# Patient Record
Sex: Male | Born: 1960 | Race: White | Hispanic: No | Marital: Married | Smoking: Former smoker
Health system: Southern US, Community
[De-identification: ages and names within clinical notes are randomized; demographics above are authoritative.]

## PROBLEM LIST (undated history)

## (undated) DIAGNOSIS — E785 Hyperlipidemia, unspecified: Secondary | ICD-10-CM

## (undated) DIAGNOSIS — M1990 Unspecified osteoarthritis, unspecified site: Secondary | ICD-10-CM

## (undated) DIAGNOSIS — R0602 Shortness of breath: Secondary | ICD-10-CM

## (undated) DIAGNOSIS — K219 Gastro-esophageal reflux disease without esophagitis: Secondary | ICD-10-CM

## (undated) DIAGNOSIS — R51 Headache: Secondary | ICD-10-CM

## (undated) DIAGNOSIS — E039 Hypothyroidism, unspecified: Secondary | ICD-10-CM

## (undated) DIAGNOSIS — M5416 Radiculopathy, lumbar region: Secondary | ICD-10-CM

## (undated) DIAGNOSIS — G8929 Other chronic pain: Secondary | ICD-10-CM

## (undated) DIAGNOSIS — M51369 Other intervertebral disc degeneration, lumbar region without mention of lumbar back pain or lower extremity pain: Secondary | ICD-10-CM

## (undated) DIAGNOSIS — N183 Chronic kidney disease, stage 3 unspecified: Secondary | ICD-10-CM

## (undated) DIAGNOSIS — M5136 Other intervertebral disc degeneration, lumbar region: Secondary | ICD-10-CM

## (undated) DIAGNOSIS — I2510 Atherosclerotic heart disease of native coronary artery without angina pectoris: Secondary | ICD-10-CM

## (undated) DIAGNOSIS — I10 Essential (primary) hypertension: Secondary | ICD-10-CM

## (undated) DIAGNOSIS — M5126 Other intervertebral disc displacement, lumbar region: Secondary | ICD-10-CM

## (undated) HISTORY — DX: Essential (primary) hypertension: I10

## (undated) HISTORY — PX: REATTACHMENT HAND: SHX5175

## (undated) HISTORY — PX: RENAL BIOPSY, PERCUTANEOUS: SUR144

---

## 1988-09-17 HISTORY — PX: ORIF TIBIA & FIBULA FRACTURES: SHX2131

## 2010-01-17 HISTORY — PX: KIDNEY TRANSPLANT: SHX239

## 2012-12-20 ENCOUNTER — Ambulatory Visit: Payer: Self-pay | Admitting: Cardiology

## 2012-12-21 ENCOUNTER — Encounter (HOSPITAL_COMMUNITY): Payer: Self-pay | Admitting: Pharmacist

## 2012-12-21 ENCOUNTER — Encounter (INDEPENDENT_AMBULATORY_CARE_PROVIDER_SITE_OTHER): Payer: Self-pay

## 2012-12-21 ENCOUNTER — Encounter: Payer: Self-pay | Admitting: *Deleted

## 2012-12-21 ENCOUNTER — Ambulatory Visit: Payer: Self-pay | Admitting: Internal Medicine

## 2012-12-21 ENCOUNTER — Encounter: Payer: Self-pay | Admitting: Internal Medicine

## 2012-12-21 ENCOUNTER — Ambulatory Visit (INDEPENDENT_AMBULATORY_CARE_PROVIDER_SITE_OTHER): Payer: Medicare Other | Admitting: Internal Medicine

## 2012-12-21 VITALS — BP 136/90 | HR 92 | Ht 70.0 in | Wt 212.0 lb

## 2012-12-21 DIAGNOSIS — R079 Chest pain, unspecified: Secondary | ICD-10-CM

## 2012-12-21 LAB — BASIC METABOLIC PANEL
CO2: 29 mEq/L (ref 19–32)
Calcium: 9.2 mg/dL (ref 8.4–10.5)
Chloride: 99 mEq/L (ref 96–112)
Creatinine, Ser: 1.4 mg/dL (ref 0.4–1.5)
GFR: 56.62 mL/min — ABNORMAL LOW (ref >60.00)
Sodium: 136 mEq/L (ref 135–145)

## 2012-12-21 LAB — CBC
HCT: 47.2 % (ref 39.0–52.0)
MCHC: 33.6 g/dL (ref 30.0–36.0)
MCV: 86.1 fl (ref 78.0–100.0)
Platelets: 246 10*3/uL (ref 150.0–400.0)
RBC: 5.48 Mil/uL (ref 4.22–5.81)

## 2012-12-21 LAB — PROTIME-INR: INR: 1.1 ratio — ABNORMAL HIGH (ref 0.8–1.0)

## 2012-12-21 NOTE — Progress Notes (Signed)
HPI About 2 wks ago  Pressure on chest  Hurts bad   Patient has horses  Walks to feed them  Chest starts hurting  Hard to breathe  Goes away after 20 min   Has been slowing down  Less energy No Chest pain at rest Kidney transplant at [facility] 2012.   Chronic LE edema.    No Known Allergies  Current Outpatient Prescriptions  Medication Sig Dispense Refill  . AMLODIPINE BESYLATE PO Take 5 mg by mouth.      . aspirin 81 MG tablet Take 81 mg by mouth daily.      . clonazePAM (KLONOPIN) 1 MG tablet Take 1 mg by mouth 2 (two) times daily.      . cyclobenzaprine (FLEXERIL) 5 MG tablet Take 5 mg by mouth as needed.      . levothyroxine (SYNTHROID, LEVOTHROID) 50 MCG tablet Take 50 mcg by mouth daily before breakfast.      . MAGNESIUM OXIDE PO Take 600 mg by mouth 2 (two) times daily.      . mycophenolate (MYFORTIC) 180 MG EC tablet Take by mouth 4 (four) times daily.      . nitroGLYCERIN (NITROSTAT) 0.4 MG SL tablet Place 0.4 mg under the tongue every 5 (five) minutes as needed for chest pain.      . Omega-3 Fatty Acids (FISH OIL) 1000 MG CPDR Take by mouth daily.      . omeprazole (PRILOSEC) 20 MG capsule Take 20 mg by mouth daily.      . Potassium & Sodium Phosphates (NEUTRA-PHOS PO) Take by mouth daily.      . simvastatin (ZOCOR) 20 MG tablet Take 20 mg by mouth daily.      . tacrolimus (PROGRAF) 1 MG capsule Take 1 mg by mouth 4 (four) times daily.      . Vitamin D, Ergocalciferol, (DRISDOL) 50000 UNITS CAPS capsule Take 50,000 Units by mouth daily.       No current facility-administered medications for this visit.    No past medical history on file.  No past surgical history on file.  Family History  Problem Relation Age of Onset  . Diabetes Father   . Heart disease Father   . Hyperlipidemia Father     History   Social History  . Marital Status: Married    Spouse Name: N/A    Number of Children: N/A  . Years of Education: N/A   Occupational History  . Not on file.    Social History Main Topics  . Smoking status: Former Smoker    Types: Cigarettes    Quit date: 12/21/1977  . Smokeless tobacco: Not on file  . Alcohol Use: Not on file  . Drug Use: Not on file  . Sexual Activity: Not on file   Other Topics Concern  . Not on file   Social History Narrative  . No narrative on file    Review of Systems:  All systems reviewed.  They are negative to the above problem except as previously stated.  Vital Signs: BP 136/90  Pulse 92  Ht 5' 10" (1.778 m)  Wt 212 lb (96.163 kg)  BMI 30.42 kg/m2  Physical Exam Patient is in NAD HEENT:  Normocephalic, atraumatic. EOMI, PERRLA.  Neck: JVP is normal.  No bruits.  Lungs: clear to auscultation. No rales no wheezes.  Heart: Regular rate and rhythm. Normal S1, S2. No S3.   No significant murmurs. PMI not displaced.  Abdomen:  Supple, nontender. Normal   bowel sounds. No masses. No hepatomegaly.  Extremities:   Good distal pulses throughout. No lower extremity edema.  Musculoskeletal :moving all extremities.  Neuro:   alert and oriented x3.  CN II-XII grossly intact.  EKG  SB 52  T wave inversion III, AVF. Assessment and Plan:  1.  CP  Patient's history is very concerning for progressive angina.  He has had no rest sympotms.  I have discussed with him and with M Mattingly  I would recomm L heart cath to define anatomy. Patient agrees to proceed CHeck labs today.   Patient will come early on day of procedure for prehydration.  2.  HTN  FOllow  3.  Renal  Check labs  Minimize contrast  May need to stage  4.  HL  Will need to check lipids.    

## 2012-12-21 NOTE — Patient Instructions (Signed)
Your physician has requested that you have a cardiac catheterization. Cardiac catheterization is used to diagnose and/or treat various heart conditions. Doctors may recommend this procedure for a number of different reasons. The most common reason is to evaluate chest pain. Chest pain can be a symptom of coronary artery disease (CAD), and cardiac catheterization can show whether plaque is narrowing or blocking your heart's arteries. This procedure is also used to evaluate the valves, as well as measure the blood flow and oxygen levels in different parts of your heart. For further information please visit [email or web address] Please follow instruction sheet, as given.  Your physician recommends that you have lab work today

## 2012-12-22 ENCOUNTER — Encounter: Payer: Self-pay | Admitting: Internal Medicine

## 2012-12-26 ENCOUNTER — Encounter (HOSPITAL_COMMUNITY): Payer: Self-pay | Admitting: General Practice

## 2012-12-26 ENCOUNTER — Ambulatory Visit (HOSPITAL_COMMUNITY)
Admission: RE | Admit: 2012-12-26 | Discharge: 2012-12-27 | Disposition: A | Discharge status: Home / Self Care | Payer: Medicare Other | Source: Ambulatory Visit | Attending: Cardiology | Admitting: Cardiology

## 2012-12-26 ENCOUNTER — Encounter (HOSPITAL_COMMUNITY)
Admission: RE | Disposition: A | Discharge status: Home / Self Care | Payer: Medicare Other | Source: Ambulatory Visit | Attending: Cardiology

## 2012-12-26 DIAGNOSIS — I10 Essential (primary) hypertension: Secondary | ICD-10-CM

## 2012-12-26 DIAGNOSIS — I129 Hypertensive chronic kidney disease with stage 1 through stage 4 chronic kidney disease, or unspecified chronic kidney disease: Secondary | ICD-10-CM | POA: Insufficient documentation

## 2012-12-26 DIAGNOSIS — K219 Gastro-esophageal reflux disease without esophagitis: Secondary | ICD-10-CM | POA: Diagnosis present

## 2012-12-26 DIAGNOSIS — E876 Hypokalemia: Secondary | ICD-10-CM | POA: Insufficient documentation

## 2012-12-26 DIAGNOSIS — N183 Chronic kidney disease, stage 3 unspecified: Secondary | ICD-10-CM | POA: Insufficient documentation

## 2012-12-26 DIAGNOSIS — Z7982 Long term (current) use of aspirin: Secondary | ICD-10-CM | POA: Insufficient documentation

## 2012-12-26 DIAGNOSIS — Z955 Presence of coronary angioplasty implant and graft: Secondary | ICD-10-CM

## 2012-12-26 DIAGNOSIS — I2510 Atherosclerotic heart disease of native coronary artery without angina pectoris: Secondary | ICD-10-CM

## 2012-12-26 DIAGNOSIS — N2889 Other specified disorders of kidney and ureter: Secondary | ICD-10-CM

## 2012-12-26 DIAGNOSIS — Z87891 Personal history of nicotine dependence: Secondary | ICD-10-CM | POA: Insufficient documentation

## 2012-12-26 DIAGNOSIS — E785 Hyperlipidemia, unspecified: Secondary | ICD-10-CM

## 2012-12-26 DIAGNOSIS — I200 Unstable angina: Secondary | ICD-10-CM

## 2012-12-26 DIAGNOSIS — E039 Hypothyroidism, unspecified: Secondary | ICD-10-CM | POA: Diagnosis present

## 2012-12-26 DIAGNOSIS — Z940 Kidney transplant status: Secondary | ICD-10-CM | POA: Insufficient documentation

## 2012-12-26 HISTORY — DX: Chronic kidney disease, stage 3 unspecified: N18.30

## 2012-12-26 HISTORY — DX: Other intervertebral disc degeneration, lumbar region without mention of lumbar back pain or lower extremity pain: M51.369

## 2012-12-26 HISTORY — DX: Atherosclerotic heart disease of native coronary artery without angina pectoris: I25.10

## 2012-12-26 HISTORY — DX: Radiculopathy, lumbar region: M54.16

## 2012-12-26 HISTORY — DX: Unspecified osteoarthritis, unspecified site: M19.90

## 2012-12-26 HISTORY — DX: Other chronic pain: G89.29

## 2012-12-26 HISTORY — PX: LEFT HEART CATHETERIZATION WITH CORONARY ANGIOGRAM: SHX5451

## 2012-12-26 HISTORY — DX: Hypothyroidism, unspecified: E03.9

## 2012-12-26 HISTORY — DX: Hyperlipidemia, unspecified: E78.5

## 2012-12-26 HISTORY — PX: CORONARY ANGIOPLASTY WITH STENT PLACEMENT: SHX49

## 2012-12-26 HISTORY — DX: Gastro-esophageal reflux disease without esophagitis: K21.9

## 2012-12-26 HISTORY — DX: Shortness of breath: R06.02

## 2012-12-26 HISTORY — DX: Other intervertebral disc displacement, lumbar region: M51.26

## 2012-12-26 HISTORY — DX: Headache: R51

## 2012-12-26 HISTORY — DX: Chronic kidney disease, stage 3 (moderate): N18.3

## 2012-12-26 HISTORY — DX: Other intervertebral disc degeneration, lumbar region: M51.36

## 2012-12-26 LAB — POTASSIUM: Potassium: 3.3 mEq/L — ABNORMAL LOW (ref 3.5–5.1)

## 2012-12-26 SURGERY — LEFT HEART CATHETERIZATION WITH CORONARY ANGIOGRAM
Anesthesia: LOCAL

## 2012-12-26 MED ORDER — FENTANYL CITRATE 0.05 MG/ML IJ SOLN
INTRAMUSCULAR | Status: AC
  Filled 2012-12-26: qty 2

## 2012-12-26 MED ORDER — SODIUM CHLORIDE 0.9 % IV SOLN
0.2500 mg/kg/h | INTRAVENOUS | Status: DC
  Filled 2012-12-26: qty 250

## 2012-12-26 MED ORDER — VERAPAMIL HCL 2.5 MG/ML IV SOLN
INTRAVENOUS | Status: AC
  Filled 2012-12-26: qty 2

## 2012-12-26 MED ORDER — ONDANSETRON HCL 4 MG/2ML IJ SOLN: 4.0000 mg | Freq: Four times a day (QID) | INTRAMUSCULAR | Status: DC | PRN

## 2012-12-26 MED ORDER — ASPIRIN EC 81 MG PO TBEC
81.0000 mg | DELAYED_RELEASE_TABLET | Freq: Every day | ORAL | Status: DC
Start: 2012-12-27 — End: 2012-12-27
  Administered 2012-12-27: 09:00:00 81 mg via ORAL
  Filled 2012-12-26: qty 1

## 2012-12-26 MED ORDER — CLONAZEPAM 1 MG PO TABS
2.0000 mg | ORAL_TABLET | Freq: Every day | ORAL | Status: DC
  Administered 2012-12-26: 2 mg via ORAL
  Filled 2012-12-26: qty 2

## 2012-12-26 MED ORDER — TACROLIMUS 1 MG PO CAPS
4.0000 mg | ORAL_CAPSULE | Freq: Two times a day (BID) | ORAL | Status: DC
  Administered 2012-12-26 – 2012-12-27 (×2): 4 mg via ORAL
  Filled 2012-12-26 (×3): qty 4

## 2012-12-26 MED ORDER — VITAMIN D (ERGOCALCIFEROL) 1.25 MG (50000 UNIT) PO CAPS: 50000.0000 [IU] | ORAL_CAPSULE | ORAL | Status: DC

## 2012-12-26 MED ORDER — AMLODIPINE BESYLATE 10 MG PO TABS
10.0000 mg | ORAL_TABLET | Freq: Every day | ORAL | Status: DC
  Administered 2012-12-27: 10 mg via ORAL
  Filled 2012-12-26: qty 1

## 2012-12-26 MED ORDER — SODIUM CHLORIDE 0.9 % IJ SOLN: 3.0000 mL | Freq: Two times a day (BID) | INTRAMUSCULAR | Status: DC

## 2012-12-26 MED ORDER — TICAGRELOR 90 MG PO TABS
ORAL_TABLET | ORAL | Status: AC
  Administered 2012-12-26: 90 mg via ORAL
  Filled 2012-12-26: qty 1

## 2012-12-26 MED ORDER — LEVOTHYROXINE SODIUM 50 MCG PO TABS
50.0000 ug | ORAL_TABLET | Freq: Every day | ORAL | Status: DC
  Administered 2012-12-27: 07:00:00 50 ug via ORAL
  Filled 2012-12-26 (×2): qty 1

## 2012-12-26 MED ORDER — ASPIRIN 81 MG PO CHEW: 81.0000 mg | CHEWABLE_TABLET | ORAL | Status: DC

## 2012-12-26 MED ORDER — NITROGLYCERIN 0.4 MG SL SUBL
SUBLINGUAL_TABLET | SUBLINGUAL | Status: AC
  Filled 2012-12-26: qty 25

## 2012-12-26 MED ORDER — HYDROCODONE-ACETAMINOPHEN 5-325 MG PO TABS
1.0000 | ORAL_TABLET | Freq: Four times a day (QID) | ORAL | Status: DC | PRN
Start: 2012-12-26 — End: 2012-12-27
  Administered 2012-12-26: 1 via ORAL

## 2012-12-26 MED ORDER — LIDOCAINE HCL (PF) 1 % IJ SOLN
INTRAMUSCULAR | Status: AC
  Filled 2012-12-26: qty 30

## 2012-12-26 MED ORDER — HYDROCODONE-ACETAMINOPHEN 5-325 MG PO TABS
ORAL_TABLET | ORAL | Status: AC
  Filled 2012-12-26: qty 1

## 2012-12-26 MED ORDER — SODIUM CHLORIDE 0.9 % IV SOLN: 250.0000 mL | INTRAVENOUS | Status: DC | PRN

## 2012-12-26 MED ORDER — SODIUM CHLORIDE 0.9 % IV SOLN
INTRAVENOUS | Status: DC
  Administered 2012-12-26: 09:00:00 via INTRAVENOUS

## 2012-12-26 MED ORDER — BIVALIRUDIN 250 MG IV SOLR
INTRAVENOUS | Status: AC
  Filled 2012-12-26: qty 250

## 2012-12-26 MED ORDER — SIMVASTATIN 20 MG PO TABS
20.0000 mg | ORAL_TABLET | Freq: Every day | ORAL | Status: DC
  Filled 2012-12-26: qty 1

## 2012-12-26 MED ORDER — CYCLOBENZAPRINE HCL 10 MG PO TABS
10.0000 mg | ORAL_TABLET | Freq: Three times a day (TID) | ORAL | Status: DC | PRN
  Administered 2012-12-26: 10 mg via ORAL
  Filled 2012-12-26: qty 1

## 2012-12-26 MED ORDER — POTASSIUM CHLORIDE CRYS ER 20 MEQ PO TBCR
40.0000 meq | EXTENDED_RELEASE_TABLET | Freq: Once | ORAL | Status: AC
  Administered 2012-12-26: 40 meq via ORAL
  Filled 2012-12-26: qty 2

## 2012-12-26 MED ORDER — SODIUM CHLORIDE 0.9 % IV SOLN
1.0000 mL/kg/h | INTRAVENOUS | Status: AC
  Administered 2012-12-26: 1 mL/kg/h via INTRAVENOUS

## 2012-12-26 MED ORDER — HEPARIN (PORCINE) IN NACL 2-0.9 UNIT/ML-% IJ SOLN
INTRAMUSCULAR | Status: AC
  Filled 2012-12-26: qty 1000

## 2012-12-26 MED ORDER — SODIUM CHLORIDE 0.9 % IJ SOLN: 3.0000 mL | INTRAMUSCULAR | Status: DC | PRN

## 2012-12-26 MED ORDER — MYCOPHENOLATE SODIUM 180 MG PO TBEC
720.0000 mg | DELAYED_RELEASE_TABLET | Freq: Two times a day (BID) | ORAL | Status: DC
  Administered 2012-12-26 – 2012-12-27 (×2): 720 mg via ORAL
  Filled 2012-12-26 (×3): qty 4

## 2012-12-26 MED ORDER — PANTOPRAZOLE SODIUM 40 MG PO TBEC
40.0000 mg | DELAYED_RELEASE_TABLET | Freq: Every day | ORAL | Status: DC
  Administered 2012-12-26 – 2012-12-27 (×2): 40 mg via ORAL
  Filled 2012-12-26 (×2): qty 1

## 2012-12-26 MED ORDER — TIZANIDINE HCL 4 MG PO TABS
4.0000 mg | ORAL_TABLET | Freq: Every evening | ORAL | Status: DC | PRN
  Filled 2012-12-26: qty 1

## 2012-12-26 MED ORDER — MAGNESIUM OXIDE 400 (241.3 MG) MG PO TABS
1200.0000 mg | ORAL_TABLET | Freq: Two times a day (BID) | ORAL | Status: DC
  Administered 2012-12-26: 17:00:00 1200 mg via ORAL
  Filled 2012-12-26 (×4): qty 3

## 2012-12-26 MED ORDER — FUROSEMIDE 20 MG PO TABS
20.0000 mg | ORAL_TABLET | Freq: Two times a day (BID) | ORAL | Status: DC
  Administered 2012-12-26 – 2012-12-27 (×2): 20 mg via ORAL
  Filled 2012-12-26 (×4): qty 1

## 2012-12-26 MED ORDER — TICAGRELOR 90 MG PO TABS
ORAL_TABLET | ORAL | Status: AC
  Filled 2012-12-26: qty 1

## 2012-12-26 MED ORDER — ACETAMINOPHEN 325 MG PO TABS: 650.0000 mg | ORAL_TABLET | ORAL | Status: DC | PRN

## 2012-12-26 MED ORDER — METOPROLOL TARTRATE 12.5 MG HALF TABLET
12.5000 mg | ORAL_TABLET | Freq: Two times a day (BID) | ORAL | Status: DC
  Administered 2012-12-26: 12.5 mg via ORAL
  Filled 2012-12-26 (×3): qty 1

## 2012-12-26 MED ORDER — NITROGLYCERIN 0.4 MG SL SUBL: 0.4000 mg | SUBLINGUAL_TABLET | SUBLINGUAL | Status: DC | PRN

## 2012-12-26 MED ORDER — INFLUENZA VAC SPLIT QUAD 0.5 ML IM SUSP
0.5000 mL | INTRAMUSCULAR | Status: DC
  Filled 2012-12-26: qty 0.5

## 2012-12-26 MED ORDER — NITROGLYCERIN 0.4 MG SL SUBL
0.4000 mg | SUBLINGUAL_TABLET | SUBLINGUAL | Status: DC | PRN
  Administered 2012-12-26 (×2): 0.4 mg via SUBLINGUAL
  Filled 2012-12-26: qty 25

## 2012-12-26 MED ORDER — MIDAZOLAM HCL 2 MG/2ML IJ SOLN
INTRAMUSCULAR | Status: AC
  Filled 2012-12-26: qty 2

## 2012-12-26 MED ORDER — SODIUM CHLORIDE 0.9 % IV BOLUS (SEPSIS)
250.0000 mL | Freq: Once | INTRAVENOUS | Status: AC
  Administered 2012-12-26: 250 mL via INTRAVENOUS

## 2012-12-26 MED ORDER — TICAGRELOR 90 MG PO TABS
90.0000 mg | ORAL_TABLET | Freq: Two times a day (BID) | ORAL | Status: DC
  Administered 2012-12-26 – 2012-12-27 (×2): 90 mg via ORAL
  Filled 2012-12-26 (×3): qty 1

## 2012-12-26 NOTE — H&P (View-Only) (Signed)
HPI About 2 wks ago  Pressure on chest  Hurts bad   Patient has horses  Walks to feed them  Chest starts hurting  Hard to breathe  Goes away after 20 min   Has been slowing down  Less energy No Chest pain at rest Kidney transplant at [facility] 2012.   Chronic LE edema.    No Known Allergies  Current Outpatient Prescriptions  Medication Sig Dispense Refill  . AMLODIPINE BESYLATE PO Take 5 mg by mouth.      Marland Kitchen aspirin 81 MG tablet Take 81 mg by mouth daily.      . clonazePAM (KLONOPIN) 1 MG tablet Take 1 mg by mouth 2 (two) times daily.      . cyclobenzaprine (FLEXERIL) 5 MG tablet Take 5 mg by mouth as needed.      Marland Kitchen levothyroxine (SYNTHROID, LEVOTHROID) 50 MCG tablet Take 50 mcg by mouth daily before breakfast.      . MAGNESIUM OXIDE PO Take 600 mg by mouth 2 (two) times daily.      . mycophenolate (MYFORTIC) 180 MG EC tablet Take by mouth 4 (four) times daily.      . nitroGLYCERIN (NITROSTAT) 0.4 MG SL tablet Place 0.4 mg under the tongue every 5 (five) minutes as needed for chest pain.      . Omega-3 Fatty Acids (FISH OIL) 1000 MG CPDR Take by mouth daily.      Marland Kitchen omeprazole (PRILOSEC) 20 MG capsule Take 20 mg by mouth daily.      . Potassium & Sodium Phosphates (NEUTRA-PHOS PO) Take by mouth daily.      . simvastatin (ZOCOR) 20 MG tablet Take 20 mg by mouth daily.      . tacrolimus (PROGRAF) 1 MG capsule Take 1 mg by mouth 4 (four) times daily.      . Vitamin D, Ergocalciferol, (DRISDOL) 50000 UNITS CAPS capsule Take 50,000 Units by mouth daily.       No current facility-administered medications for this visit.    No past medical history on file.  No past surgical history on file.  Family History  Problem Relation Age of Onset  . Diabetes Father   . Heart disease Father   . Hyperlipidemia Father     History   Social History  . Marital Status: Married    Spouse Name: N/A    Number of Children: N/A  . Years of Education: N/A   Occupational History  . Not on file.    Social History Main Topics  . Smoking status: Former Smoker    Types: Cigarettes    Quit date: 12/21/1977  . Smokeless tobacco: Not on file  . Alcohol Use: Not on file  . Drug Use: Not on file  . Sexual Activity: Not on file   Other Topics Concern  . Not on file   Social History Narrative  . No narrative on file    Review of Systems:  All systems reviewed.  They are negative to the above problem except as previously stated.  Vital Signs: BP 136/90  Pulse 92  Ht 5\' 10"  (1.778 m)  Wt 212 lb (96.163 kg)  BMI 30.42 kg/m2  Physical Exam Patient is in NAD HEENT:  Normocephalic, atraumatic. EOMI, PERRLA.  Neck: JVP is normal.  No bruits.  Lungs: clear to auscultation. No rales no wheezes.  Heart: Regular rate and rhythm. Normal S1, S2. No S3.   No significant murmurs. PMI not displaced.  Abdomen:  Supple, nontender. Normal  bowel sounds. No masses. No hepatomegaly.  Extremities:   Good distal pulses throughout. No lower extremity edema.  Musculoskeletal :moving all extremities.  Neuro:   alert and oriented x3.  CN II-XII grossly intact.  EKG  SB 52  T wave inversion III, AVF. Assessment and Plan:  1.  CP  Patient's history is very concerning for progressive angina.  He has had no rest sympotms.  I have discussed with him and with Renee Rival  I would recomm L heart cath to define anatomy. Patient agrees to proceed CHeck labs today.   Patient will come early on day of procedure for prehydration.  2.  HTN  FOllow  3.  Renal  Check labs  Minimize contrast  May need to stage  4.  HL  Will need to check lipids.

## 2012-12-26 NOTE — Interval H&P Note (Signed)
History and Physical Interval Note:  12/26/2012 10:26 AM  Robert Walton  has presented today for surgery, with the diagnosis of Chest pain  The various methods of treatment have been discussed with the patient and family. After consideration of risks, benefits and other options for treatment, the patient has consented to  Procedure(s): LEFT HEART CATHETERIZATION WITH CORONARY ANGIOGRAM (N/A) as a surgical intervention .  The patient's history has been reviewed, patient examined, no change in status, stable for surgery.  I have reviewed the patient's chart and labs.  Questions were answered to the patient's satisfaction.   Cath Lab Visit (complete for each Cath Lab visit)  Clinical Evaluation Leading to the Procedure:   ACS: yes  Non-ACS:    Anginal Classification: CCS III  Anti-ischemic medical therapy: Minimal Therapy (1 class of medications)  Non-Invasive Test Results: No non-invasive testing performed  Prior CABG: No previous CABG        Theron Arista [facility] 12/26/2012 10:26 AM

## 2012-12-26 NOTE — CV Procedure (Signed)
    Cardiac Catheterization Procedure Note  Name: Robert Walton MRN: [id number] DOB: 19-Jun-1960  Procedure: Left Heart Cath, Selective Coronary Angiography, LV angiography, PTCA and stenting of the first OM.  Indication: 52 yo WM with history of HTN and hyperlipidemia presents with unstable angina.  Procedural Details:  The right wrist was prepped, draped, and anesthetized with 1% lidocaine. Using the modified Seldinger technique, a 5 French sheath was introduced into the right radial artery. 3 mg of verapamil was administered through the sheath, weight-based unfractionated heparin was administered intravenously. Standard Judkins catheters were used for selective coronary angiography and left ventriculography. Catheter exchanges were performed over an exchange length guidewire.  PROCEDURAL FINDINGS Hemodynamics: AO 114/72 mean 91 mm Hg LV 112/7 mm Hg   Coronary angiography: Coronary dominance: left  Left mainstem: Short, normal.  Left anterior descending (LAD): Normal.  Left circumflex (LCx): large dominant vessel. The first OM has a 99% stenosis with TIMI 1 flow. It is a large bifurcation vessel. There is 20-30% disease in the PDA.  Right coronary artery (RCA): Small, normal.  Left ventriculography: Left ventricular systolic function is normal, LVEF is estimated at 55-65%, there is no significant mitral regurgitation   PCI Note:  Following the diagnostic procedure, the decision was made to proceed with PCI. Brilinta 180 mg was given orally.  Weight-based bivalirudin was given for anticoagulation. Once a therapeutic ACT was achieved, a 6 [place] FL3.5 guide catheter was inserted. Guide selection was difficult given the short left main.   A prowater coronary guidewire was used to access the left circumflex artery. A second prowater wire was used to cross the lesion.  The lesion was predilated with a 2.5 mm balloon.  The lesion was then stented with a 2.5 x 20 mm Promus stent.  The stent  was postdilated with a 2.75 mm noncompliant balloon.  Following PCI, there was 0% residual stenosis and TIMI-3 flow. Final angiography confirmed an excellent result. The patient tolerated the procedure well. There were no immediate procedural complications. A TR band was used for radial hemostasis. The patient was transferred to the post catheterization recovery area for further monitoring.  PCI Data: Vessel - OM1/Segment - proximal Percent Stenosis (pre)  99% TIMI-flow 1 Stent 2.5 x 20 mm Promus Percent Stenosis (post) 0% TIMI-flow (post) 3  Final Conclusions:   1. Single vessel obstructive CAD  2. Normal LV function.   Recommendations:  Continue dual antiplatelet therapy for one year.  Theron Arista [facility] 12/26/2012, 11:30 AM

## 2012-12-26 NOTE — Progress Notes (Signed)
O2 STARTED AT 2L/MIN

## 2012-12-26 NOTE — Progress Notes (Signed)
TR BAND REMOVAL  LOCATION:    right radial  DEFLATED PER PROTOCOL:    yes  TIME BAND OFF / DRESSING APPLIED:    1600   SITE UPON ARRIVAL:    Level 0  SITE AFTER BAND REMOVAL:    Level 0  REVERSE ALLEN'S TEST:     positive  CIRCULATION SENSATION AND MOVEMENT:    Within Normal Limits   yes  COMMENTS:   Gauze dressing applied at 1600. Dressing at 1630 dry and intact and CSMs wnls, radial and ulnar pulse present and palpable.

## 2012-12-27 ENCOUNTER — Encounter (HOSPITAL_COMMUNITY): Payer: Self-pay | Admitting: Nurse Practitioner

## 2012-12-27 ENCOUNTER — Other Ambulatory Visit: Payer: Self-pay | Admitting: Nurse Practitioner

## 2012-12-27 DIAGNOSIS — E039 Hypothyroidism, unspecified: Secondary | ICD-10-CM | POA: Diagnosis present

## 2012-12-27 DIAGNOSIS — K219 Gastro-esophageal reflux disease without esophagitis: Secondary | ICD-10-CM | POA: Diagnosis present

## 2012-12-27 DIAGNOSIS — E785 Hyperlipidemia, unspecified: Secondary | ICD-10-CM | POA: Diagnosis present

## 2012-12-27 DIAGNOSIS — R0602 Shortness of breath: Secondary | ICD-10-CM | POA: Insufficient documentation

## 2012-12-27 DIAGNOSIS — I2510 Atherosclerotic heart disease of native coronary artery without angina pectoris: Secondary | ICD-10-CM

## 2012-12-27 DIAGNOSIS — I10 Essential (primary) hypertension: Secondary | ICD-10-CM | POA: Diagnosis present

## 2012-12-27 DIAGNOSIS — I200 Unstable angina: Secondary | ICD-10-CM

## 2012-12-27 LAB — BASIC METABOLIC PANEL
Calcium: 8.9 mg/dL (ref 8.4–10.5)
Chloride: 102 mEq/L (ref 96–112)
Creatinine, Ser: 1.5 mg/dL — ABNORMAL HIGH (ref 0.50–1.35)
GFR calc non Af Amer: 51 mL/min — ABNORMAL LOW (ref >90)
Glucose, Bld: 114 mg/dL — ABNORMAL HIGH (ref 70–99)
Sodium: 138 mEq/L (ref 135–145)

## 2012-12-27 LAB — CBC
HCT: 43.2 % (ref 39.0–52.0)
MCH: 28.5 pg (ref 26.0–34.0)
MCHC: 34 g/dL (ref 30.0–36.0)
MCV: 83.9 fL (ref 78.0–100.0)
Platelets: 203 10*3/uL (ref 150–400)

## 2012-12-27 MED ORDER — POTASSIUM CHLORIDE CRYS ER 20 MEQ PO TBCR
40.0000 meq | EXTENDED_RELEASE_TABLET | Freq: Once | ORAL | Status: AC
  Administered 2012-12-27: 10:00:00 40 meq via ORAL
  Filled 2012-12-27: qty 2

## 2012-12-27 MED ORDER — TICAGRELOR 90 MG PO TABS: 90.0000 mg | ORAL_TABLET | Freq: Two times a day (BID) | ORAL | Status: DC

## 2012-12-27 MED ORDER — METOPROLOL TARTRATE 25 MG PO TABS: 25.0000 mg | ORAL_TABLET | Freq: Two times a day (BID) | ORAL | Status: DC

## 2012-12-27 MED ORDER — METOPROLOL TARTRATE 25 MG PO TABS
25.0000 mg | ORAL_TABLET | Freq: Two times a day (BID) | ORAL | Status: DC
  Administered 2012-12-27: 25 mg via ORAL

## 2012-12-27 MED FILL — Sodium Chloride IV Soln 0.9%: INTRAVENOUS | Qty: 50 | Status: AC

## 2012-12-27 NOTE — Discharge Summary (Signed)
See my full note. cdm 

## 2012-12-27 NOTE — Progress Notes (Signed)
CARDIAC REHAB PHASE I   PRE:  Rate/Rhythm: 83SR  BP:  Supine: 129/89  Sitting:   Standing:    SaO2:   MODE:  Ambulation: 800 ft   POST:  Rate/Rhythm: 91SR  BP:  Supine:   Sitting: 134/85  Standing:    SaO2:  4010-2725 Pt walked 800 ft on RA with steady gait. Tolerated well. No CP. Education completed with pt and wife. Understanding voiced. Concerned about whether they will be able to afford brilinta after free month. Wife to check with pharmacy to see what it will cost. Stressed importance of taking antiplatelet med and told them to discuss with cardiologist at visit if they cannot afford brilinta so that another med can be given. Discussed CRP 2 and pt gave permission to refer to [facility] Phase 2.   Luetta Nutting, RN BSN  12/27/2012 8:39 AM

## 2012-12-27 NOTE — Progress Notes (Signed)
     SUBJECTIVE: NO chest pain or SOB. NO events.   BP 123/82  Pulse 85  Temp(Src) 97.9 F (36.6 C) (Oral)  Resp 16  Ht 5\' 10"  (1.778 m)  Wt 213 lb 6.5 oz (96.8 kg)  BMI 30.62 kg/m2  SpO2 94%  Intake/Output Summary (Last 24 hours) at 12/27/12 9604 Last data filed at 12/26/12 2300  Gross per 24 hour  Intake   1135 ml  Output   1400 ml  Net   -265 ml    PHYSICAL EXAM General: Well developed, well nourished, in no acute distress. Alert and oriented x 3.  Psych:  Good affect, responds appropriately Neck: No JVD. No masses noted.  Lungs: Clear bilaterally with no wheezes or rhonci noted.  Heart: RRR with no murmurs noted. Abdomen: Bowel sounds are present. Soft, non-tender.  Extremities: No lower extremity edema.   LABS: Basic Metabolic Panel:  Recent Labs  54/09/81 0910 12/27/12 0520  NA  --  138  K 3.3* 3.6  CL  --  102  CO2  --  27  GLUCOSE  --  114*  BUN  --  12  CREATININE  --  1.50*  CALCIUM  --  8.9   CBC:  Recent Labs  12/27/12 0520  WBC 4.1  HGB 14.7  HCT 43.2  MCV 83.9  PLT 203   Current Meds: . amLODipine  10 mg Oral Daily  . aspirin EC  81 mg Oral Daily  . clonazePAM  2 mg Oral QHS  . furosemide  20 mg Oral BID  . influenza vac split quadrivalent PF  0.5 mL Intramuscular Tomorrow-1000  . levothyroxine  50 mcg Oral QAC breakfast  . magnesium oxide  1,200 mg Oral Q12H  . metoprolol tartrate  12.5 mg Oral BID  . mycophenolate  720 mg Oral BID  . pantoprazole  40 mg Oral Daily  . simvastatin  20 mg Oral q1800  . tacrolimus  4 mg Oral BID  . Ticagrelor  90 mg Oral BID  . [START ON 12/31/2012] Vitamin D (Ergocalciferol)  50,000 Units Oral Q7 days    ASSESSMENT AND PLAN:  1. CAD/Unstable angina: Pt admitted yesterday after outpatient cath per Dr. [place]. Found to have severe stenosis OM1. Now s/p DES x 1 OM1. He will need dual anti-platelet therapy with ASA and Brilinta for at least one year. He has been started on a beta blocker. Continue  statin.   2. HTN: BP controlled. No changes.   3. S/p kidney transplant: Renal function at baseline this am.   4. Hypokalemia: Replace potassium this am.   5. Dispo: D/C home today. Follow up with Dr. Tenny Craw in 2-3 weeks. He will need a Brilinta starter packet.   Robert Walton  12/11/20146:58 AM

## 2012-12-27 NOTE — Discharge Summary (Signed)
Discharge Summary   Patient ID: Robert Walton,  MRN: [id number], DOB/AGE: 1960/05/13 52 y.o.  Admit date: 12/26/2012 Discharge date: 12/27/2012  Primary Care Provider: Abelina Bachelor Primary Cardiologist: Lovina Reach, MD  Primary Nephrologist: Renee Rival, MD  Discharge Diagnoses Principal Problem:   Unstable angina  **s/p PCI and drug-eluting stent placement to the first obtuse marginal branch of the left circumflex this admission.  Active Problems:   Coronary atherosclerosis of native coronary artery   Chronic renal insufficiency, stage III (moderate)   HTN (hypertension)   Hyperlipidemia   Hypothyroidism   GERD (gastroesophageal reflux disease)  Allergies No Known Allergies  Procedures  Cardiac Catheterization and Percutaneous Coronary Intervention 12.10.2014  PROCEDURAL FINDINGS Hemodynamics: AO 114/72 mean 91 mm Hg LV 112/7 mm Hg              Coronary angiography: Coronary dominance: left  Left mainstem: Short, normal. Left anterior descending (LAD): Normal.  Left circumflex (LCx): large dominant vessel. The first OM has a 99% stenosis with TIMI 1 flow. It is a large bifurcation vessel. There is 20-30% disease in the PDA.   **The OM1 was successfully stented using a 2.5 x 20 mm Promus drug-eluting stent.**  Right coronary artery (RCA): Small, normal. Left ventriculography: Left ventricular systolic function is normal, LVEF is estimated at 55-65%, there is no significant mitral regurgitation  _____________   History of Present Illness  52 y/o male with a prior h/o HTN, hyperlipidemia, and hypothyroidism, without prior cardiac history, who was in his usual state of health until approximately 3 wks prior to admission, when he began to experience exertional chest discomfort and fatigue.  He was seen in cardiology clinic on 12/5, and decision was made to pursue diagnostic catheterization.   Hospital Course  Patient presented to [facility] on December 10, and  was hydrated pre-cath.  Diagnostic catheterization was then performed and revealed a severe stenosis within the first obtuse marginal branch of the left circumflex.  He otherwise had non-obstructive CAD and normal LV function.  The OM1 was then successfully stented using a 2.5 x 20 mm Promus drug-eluting stent.  Robert Walton tolerated the procedure well and post-procedure has had no recurrence of chest discomfort.  He has been ambulating without limitations and his creatinine remains stable this morning at 1.50.  He will be discharged home today in good condition.  We have arranged for f/u bmet next week and he will otherwise f/u in cardiology clinic on 12/22.  Discharge Vitals Blood pressure 123/82, pulse 85, temperature 97.9 F (36.6 C), temperature source Oral, resp. rate 16, height 5\' 10"  (1.778 m), weight 213 lb 6.5 oz (96.8 kg), SpO2 94.00%.  Filed Weights   12/26/12 0835 12/26/12 2347  Weight: 212 lb (96.163 kg) 213 lb 6.5 oz (96.8 kg)   Labs  CBC  Recent Labs  12/27/12 0520  WBC 4.1  HGB 14.7  HCT 43.2  MCV 83.9  PLT 203   Basic Metabolic Panel  Recent Labs  12/26/12 0910 12/27/12 0520  NA  --  138  K 3.3* 3.6  CL  --  102  CO2  --  27  GLUCOSE  --  114*  BUN  --  12  CREATININE  --  1.50*  CALCIUM  --  8.9   Disposition  Pt is being discharged home today in good condition.  Follow-up Plans & Appointments      Follow-up Information   Follow up with [facility] On 12/31/2012. (blood  chemistry - anytime between 9am and 3pm.)    Contact information:   [address] [place] [phone]      Follow up with Dyke Maes, MD. (as scheduled.)    Specialty:  Nephrology   Contact information:   [address] [place] [address] [phone]       Follow up with Abelina Bachelor, MD. (as scheduled)    Specialty:  Family Medicine   Contact information:   [address] [place] [address] [phone]       Follow  up with Dietrich Pates, MD On 01/07/2013. (10:45 AM)    Specialty:  Cardiology   Contact information:   [address] [place] [address] [phone]      Discharge Medications    Medication List         amLODipine 10 MG tablet  Commonly known as:  NORVASC  Take 10 mg by mouth daily.     aspirin EC 81 MG tablet  Take 81 mg by mouth daily.     clonazePAM 1 MG tablet  Commonly known as:  KLONOPIN  Take 2 mg by mouth at bedtime.     FLEXERIL 10 MG tablet  Generic drug:  cyclobenzaprine  Take 10 mg by mouth 3 (three) times daily as needed (for pain).     furosemide 20 MG tablet  Commonly known as:  LASIX  Take 20 mg by mouth 2 (two) times daily.     HYDROcodone-acetaminophen 5-325 MG per tablet  Commonly known as:  NORCO/VICODIN  Take 1 tablet by mouth every 6 (six) hours as needed (pain).     levothyroxine 50 MCG tablet  Commonly known as:  SYNTHROID, LEVOTHROID  Take 50 mcg by mouth daily before breakfast.     magnesium oxide 400 MG tablet  Commonly known as:  MAG-OX  Take 1,200 mg by mouth 2 (two) times daily. Take 2 hours before taking prograf and myfortic     metoprolol tartrate 25 MG tablet  Commonly known as:  LOPRESSOR  Take 1 tablet (25 mg total) by mouth 2 (two) times daily.     mycophenolate 180 MG EC tablet  Commonly known as:  MYFORTIC  Take 720 mg by mouth 2 (two) times daily.     nitroGLYCERIN 0.4 MG SL tablet  Commonly known as:  NITROSTAT  Place 0.4 mg under the tongue every 5 (five) minutes as needed for chest pain.     omeprazole 20 MG capsule  Commonly known as:  PRILOSEC  Take 20 mg by mouth daily.     simvastatin 20 MG tablet  Commonly known as:  ZOCOR  Take 20 mg by mouth daily.     tacrolimus 1 MG capsule  Commonly known as:  PROGRAF  Take 4 mg by mouth 2 (two) times daily.     Ticagrelor 90 MG Tabs tablet  Commonly known as:  BRILINTA  Take 1 tablet (90 mg total) by mouth 2 (two) times daily.     tiZANidine  4 MG tablet  Commonly known as:  ZANAFLEX  Take 4 mg by mouth at bedtime as needed for muscle spasms.     Vitamin D (Ergocalciferol) 50000 UNITS Caps capsule  Commonly known as:  DRISDOL  Take 50,000 Units by mouth every 7 (seven) days. Take on modays       Outstanding Labs/Studies  BMET next week.  Duration of Discharge Encounter   Greater than 30 minutes  including physician time.  Signed, Nicolasa Ducking NP 12/27/2012, 8:09 AM

## 2012-12-27 NOTE — Progress Notes (Signed)
Tele shows st 100's at rest and HR 120's with minimal activity of moving in bed or using urinal.  BP 133/94.  Dr Terressa Koyanagi informed, orders received, metoprolol given PO after medication discussed w/ pt.

## 2012-12-31 ENCOUNTER — Other Ambulatory Visit (INDEPENDENT_AMBULATORY_CARE_PROVIDER_SITE_OTHER): Payer: Medicare Other

## 2012-12-31 ENCOUNTER — Telehealth: Payer: Self-pay

## 2012-12-31 ENCOUNTER — Telehealth: Payer: Self-pay | Admitting: Cardiology

## 2012-12-31 LAB — BASIC METABOLIC PANEL
BUN: 20 mg/dL (ref 6–23)
Calcium: 9.3 mg/dL (ref 8.4–10.5)
GFR: 41.48 mL/min — ABNORMAL LOW (ref >60.00)
Potassium: 3.5 mEq/L (ref 3.5–5.1)
Sodium: 140 mEq/L (ref 135–145)

## 2012-12-31 NOTE — Telephone Encounter (Signed)
PATIENT WAS HERE FOR LAB AND NEEDS SAMPLE OF BRINITLA, GAVE HIM SAMPLES UP

## 2012-12-31 NOTE — Telephone Encounter (Signed)
Walk in pt Form " [place] & [place] Application"  Dropped Off gave to [facility]

## 2013-01-07 ENCOUNTER — Ambulatory Visit (INDEPENDENT_AMBULATORY_CARE_PROVIDER_SITE_OTHER): Payer: Medicare Other | Admitting: Internal Medicine

## 2013-01-07 ENCOUNTER — Encounter: Payer: Self-pay | Admitting: Internal Medicine

## 2013-01-07 VITALS — BP 123/82 | HR 75 | Ht 70.0 in | Wt 208.8 lb

## 2013-01-07 DIAGNOSIS — I2510 Atherosclerotic heart disease of native coronary artery without angina pectoris: Secondary | ICD-10-CM

## 2013-01-07 LAB — LIPID PANEL
Cholesterol: 166 mg/dL (ref 0–200)
HDL: 36.1 mg/dL — ABNORMAL LOW (ref >39.00)
Total CHOL/HDL Ratio: 5
Triglycerides: 172 mg/dL — ABNORMAL HIGH (ref 0.0–149.0)
VLDL: 34.4 mg/dL (ref 0.0–40.0)

## 2013-01-07 LAB — BASIC METABOLIC PANEL
CO2: 26 mEq/L (ref 19–32)
Calcium: 9.7 mg/dL (ref 8.4–10.5)
GFR: 48.47 mL/min — ABNORMAL LOW (ref >60.00)
Sodium: 137 mEq/L (ref 135–145)

## 2013-01-07 NOTE — Progress Notes (Signed)
HPI Patient is a 52 yo with a history of HTN, renal Tx. I saw him a few wks ago  Came in with symptoms worrisome for porgressive angina  I recomm LHC He had this done  It showed: 99% lesion in a large OM (L dominant system)  LAD and RCA normal Patient underwent PTCA/PROMUS stent placement  Since d/c he has felt mucht better  Slowly increasing his activity. Denies CP  Breathing is better.     No Known Allergies  Current Outpatient Prescriptions  Medication Sig Dispense Refill  . amLODipine (NORVASC) 10 MG tablet Take 10 mg by mouth daily.      Marland Kitchen aspirin EC 81 MG tablet Take 81 mg by mouth daily.      . clonazePAM (KLONOPIN) 1 MG tablet Take 2 mg by mouth at bedtime.       . cyclobenzaprine (FLEXERIL) 10 MG tablet Take 10 mg by mouth 3 (three) times daily as needed (for pain).      . furosemide (LASIX) 20 MG tablet Take 20 mg by mouth 2 (two) times daily.      Marland Kitchen HYDROcodone-acetaminophen (NORCO/VICODIN) 5-325 MG per tablet Take 1 tablet by mouth every 6 (six) hours as needed (pain).      Marland Kitchen levothyroxine (SYNTHROID, LEVOTHROID) 50 MCG tablet Take 50 mcg by mouth daily before breakfast.      . magnesium oxide (MAG-OX) 400 MG tablet Take 1,200 mg by mouth 2 (two) times daily. Take 2 hours before taking prograf and myfortic      . metoprolol tartrate (LOPRESSOR) 25 MG tablet Take 1 tablet (25 mg total) by mouth 2 (two) times daily.  60 tablet  6  . mycophenolate (MYFORTIC) 180 MG EC tablet Take 720 mg by mouth 2 (two) times daily.       . nitroGLYCERIN (NITROSTAT) 0.4 MG SL tablet Place 0.4 mg under the tongue every 5 (five) minutes as needed for chest pain.      Marland Kitchen omeprazole (PRILOSEC) 20 MG capsule Take 20 mg by mouth daily.      . simvastatin (ZOCOR) 40 MG tablet Take 40 mg by mouth daily.      . tacrolimus (PROGRAF) 1 MG capsule Take 4 mg by mouth 2 (two) times daily.       . Ticagrelor (BRILINTA) 90 MG TABS tablet Take 1 tablet (90 mg total) by mouth 2 (two) times daily.  60 tablet  6  .  tiZANidine (ZANAFLEX) 4 MG tablet Take 4 mg by mouth at bedtime as needed for muscle spasms.      . Vitamin D, Ergocalciferol, (DRISDOL) 50000 UNITS CAPS capsule Take 50,000 Units by mouth every 7 (seven) days. Take on modays       No current facility-administered medications for this visit.    Past Medical History  Diagnosis Date  . Hypertension   . CKD (chronic kidney disease), stage III     a. s/p renal transplant 2012.  . Bulging lumbar disc     "I've got 5; they've done 3 injections then cut nerves last time" (12/26/2008)  . Hyperlipidemia   . Exertional shortness of breath     "just for the last 3 wks" (12/26/2012)  . Hypothyroidism   . GERD (gastroesophageal reflux disease)   . ZOXWRUEA(540.9)     "probably monthly" (12/26/2012)  . Arthritis     "my back's eat up w/arthritis" (12/26/2012()  . Chronic radicular pain of lower back     "both legs" (12/26/2012)  .  CAD (coronary artery disease)     a. 12/2012 Cath/PCI: LM nl, LAD nl, LCX dom, nl, OM1 99 (2.5x20 Promus DES), LPDA 20-30, RCA sm, nl, EF 55-65%.    Past Surgical History  Procedure Laterality Date  . Coronary angioplasty with stent placement  12/26/2012    "1" (12/26/2012)  . Kidney transplant  2012  . Renal biopsy, percutaneous Right ~ 2006  . Reattachment hand Right 1979; 1986    "middle finger; thumb"  . Orif tibia & fibula fractures Right 1990's    "titanium rod and 4 screws"    Family History  Problem Relation Age of Onset  . Diabetes Father   . Heart disease Father   . Hyperlipidemia Father     History   Social History  . Marital Status: Married    Spouse Name: N/A    Number of Children: N/A  . Years of Education: N/A   Occupational History  . Not on file.   Social History Main Topics  . Smoking status: Former Smoker -- 1.50 packs/day for 2 years    Types: Cigarettes    Quit date: 12/21/1977  . Smokeless tobacco: Never Used  . Alcohol Use: No  . Drug Use: No  . Sexual Activity: Yes    Other Topics Concern  . Not on file   Social History Narrative  . No narrative on file    Review of Systems:  All systems reviewed.  They are negative to the above problem except as previously stated.  Vital Signs: BP 123/82  Pulse 75  Ht 5\' 10"  (1.778 m)  Wt 208 lb 12.8 oz (94.711 kg)  BMI 29.96 kg/m2  Physical Exam Patient is in NAD HEENT:  Normocephalic, atraumatic. EOMI, PERRLA.  Neck: JVP is normal.  No bruits.  Lungs: clear to auscultation. No rales no wheezes.  Heart: Regular rate and rhythm. Normal S1, S2. No S3.   No significant murmurs. PMI not displaced.  Abdomen:  Supple, nontender. Normal bowel sounds. No masses. No hepatomegaly.  Extremities:   Good distal pulses throughout. No lower extremity edema.  Musculoskeletal :moving all extremities.  Neuro:   alert and oriented x3.  CN II-XII grossly intact.  EKG  SB 52  T wave inversion III, AVF. Assessment and Plan:  1.  CAD  Recent intervention  Doing well  Continue to increase activity  2.  HTN  FOllow  3.  Renal  Recehck labs.    4.  HL  Will need to check lipids.     F/U in April.

## 2013-01-07 NOTE — Patient Instructions (Addendum)
Your physician wants you to follow-up in: 4 MONTHS WITH DR Tenny Craw You will receive a reminder letter in the mail two months in advance. If you don't receive a letter, please call our office to schedule the follow-up appointment.   Your physician recommends that you HAVE LAB WORK TODAY

## 2013-02-05 ENCOUNTER — Other Ambulatory Visit: Payer: Self-pay | Admitting: *Deleted

## 2013-02-05 MED ORDER — ROSUVASTATIN CALCIUM 10 MG PO TABS: 10.0000 mg | ORAL_TABLET | Freq: Every day | ORAL | Status: DC

## 2013-02-25 ENCOUNTER — Other Ambulatory Visit (HOSPITAL_COMMUNITY): Payer: Self-pay | Admitting: Nurse Practitioner

## 2013-03-18 ENCOUNTER — Telehealth: Payer: Self-pay | Admitting: *Deleted

## 2013-03-18 NOTE — Telephone Encounter (Signed)
Patient called for crestor and brilinta samples, 4 weeks crestor, 2 weeks brilinta

## 2013-03-19 NOTE — Telephone Encounter (Signed)
Wife states they completed patient assistance form 12/2012, [place] and [place], they have no record of form being faxed for brilinta assistance, will mail form to patient and get Dr Tenny Crawoss to sign on 03/21/2013.

## 2013-03-21 ENCOUNTER — Encounter: Payer: Self-pay | Admitting: *Deleted

## 2013-03-21 NOTE — Telephone Encounter (Signed)
This encounter was created in error - please disregard.

## 2013-03-21 NOTE — Telephone Encounter (Signed)
[facility] [place] for [facility] Brilinta.

## 2013-03-26 ENCOUNTER — Telehealth: Payer: Self-pay | Admitting: *Deleted

## 2013-03-26 ENCOUNTER — Other Ambulatory Visit: Payer: Self-pay | Admitting: *Deleted

## 2013-03-26 ENCOUNTER — Telehealth: Payer: Self-pay | Admitting: Internal Medicine

## 2013-03-26 NOTE — Telephone Encounter (Signed)
See other note

## 2013-03-26 NOTE — Telephone Encounter (Signed)
New message    brilintia & crestor    [facility]

## 2013-03-26 NOTE — Telephone Encounter (Signed)
Patient request crestor and brilinta samples or co-pay cards. I will place at the front desk.

## 2013-04-01 NOTE — Telephone Encounter (Signed)
Received patients [facility] for his Brilinta assistance, I faxed that along with scripts from Dr Tenny Crawoss.

## 2013-04-09 NOTE — Telephone Encounter (Signed)
New Prob   Pt has some questions regarding prescriptions and possible authorization difficulties. Please call.

## 2013-04-10 ENCOUNTER — Telehealth: Payer: Self-pay

## 2013-04-10 NOTE — Telephone Encounter (Signed)
Patient called about assist application for crestor and brilinta I told him that I would give the message to Addison LankKim Adams and I would also place him some samples of crestor or brilinta

## 2013-04-10 NOTE — Telephone Encounter (Signed)
Spoke with pt wife, they are questioning the A to Z application. Let pt know kim will be back in the office 04-10-13 and will her give them a call.

## 2013-06-05 ENCOUNTER — Telehealth: Payer: Self-pay | Admitting: *Deleted

## 2013-06-05 NOTE — Telephone Encounter (Signed)
Patient requests crestor and brilinta samples. I will place at the front desk for pick up.

## 2013-06-06 NOTE — Progress Notes (Signed)
HPI Patient is a 53 yo with a history of HTN, renal Tx. I saw him earlier this winter.  Cath for progressive angina in fall.   It showed: 99% lesion in a large OM (L dominant system)  LAD and RCA normal Patient underwent PTCA/PROMUS stent placement SInce seen  He denies CP  Still gets SOB with activty  Feels he has no energy Does feel better than December   Works on farm.   Due to see Dr Briant CedarMattingly next wk  Prograf is off No Known Allergies  Current Outpatient Prescriptions  Medication Sig Dispense Refill  . amLODipine (NORVASC) 10 MG tablet Take 10 mg by mouth daily.      Marland Kitchen. aspirin EC 81 MG tablet Take 81 mg by mouth daily.      . clonazePAM (KLONOPIN) 1 MG tablet Take 2 mg by mouth at bedtime.       . cyclobenzaprine (FLEXERIL) 10 MG tablet Take 10 mg by mouth 3 (three) times daily as needed (for pain).      . furosemide (LASIX) 20 MG tablet Take 20 mg by mouth 2 (two) times daily.      Marland Kitchen. HYDROcodone-acetaminophen (NORCO/VICODIN) 5-325 MG per tablet Take 1 tablet by mouth every 6 (six) hours as needed (pain).      Marland Kitchen. levothyroxine (SYNTHROID, LEVOTHROID) 50 MCG tablet Take 50 mcg by mouth daily before breakfast.      . magnesium oxide (MAG-OX) 400 MG tablet Take 1,200 mg by mouth 2 (two) times daily. Take 2 hours before taking prograf and myfortic      . metoprolol tartrate (LOPRESSOR) 25 MG tablet Take 1 tablet (25 mg total) by mouth 2 (two) times daily.  60 tablet  6  . mycophenolate (MYFORTIC) 180 MG EC tablet Take 720 mg by mouth 2 (two) times daily.       . nitroGLYCERIN (NITROSTAT) 0.4 MG SL tablet Place 0.4 mg under the tongue every 5 (five) minutes as needed for chest pain.      Marland Kitchen. omeprazole (PRILOSEC) 20 MG capsule Take 20 mg by mouth daily.      . rosuvastatin (CRESTOR) 10 MG tablet Take 1 tablet (10 mg total) by mouth daily.  30 tablet  12  . tacrolimus (PROGRAF) 1 MG capsule Take 4 mg by mouth 2 (two) times daily.       . Ticagrelor (BRILINTA) 90 MG TABS tablet Take 1 tablet  (90 mg total) by mouth 2 (two) times daily.  60 tablet  6  . tiZANidine (ZANAFLEX) 4 MG tablet Take 4 mg by mouth at bedtime as needed for muscle spasms.      . Vitamin D, Ergocalciferol, (DRISDOL) 50000 UNITS CAPS capsule Take 50,000 Units by mouth every 7 (seven) days. Take on modays       No current facility-administered medications for this visit.    Past Medical History  Diagnosis Date  . Hypertension   . CKD (chronic kidney disease), stage III     a. s/p renal transplant 2012.  . Bulging lumbar disc     "I've got 5; they've done 3 injections then cut nerves last time" (12/26/2008)  . Hyperlipidemia   . Exertional shortness of breath     "just for the last 3 wks" (12/26/2012)  . Hypothyroidism   . GERD (gastroesophageal reflux disease)   . NGEXBMWU(132.4Headache(784.0)     "probably monthly" (12/26/2012)  . Arthritis     "my back's eat up w/arthritis" (12/26/2012()  . Chronic radicular  pain of lower back     "both legs" (12/26/2012)  . CAD (coronary artery disease)     a. 12/2012 Cath/PCI: LM nl, LAD nl, LCX dom, nl, OM1 99 (2.5x20 Promus DES), LPDA 20-30, RCA sm, nl, EF 55-65%.    Past Surgical History  Procedure Laterality Date  . Coronary angioplasty with stent placement  12/26/2012    "1" (12/26/2012)  . Kidney transplant  2012  . Renal biopsy, percutaneous Right ~ 2006  . Reattachment hand Right 1979; 1986    "middle finger; thumb"  . Orif tibia & fibula fractures Right 1990's    "titanium rod and 4 screws"    Family History  Problem Relation Age of Onset  . Diabetes Father   . Heart disease Father   . Hyperlipidemia Father     History   Social History  . Marital Status: Married    Spouse Name: N/A    Number of Children: N/A  . Years of Education: N/A   Occupational History  . Not on file.   Social History Main Topics  . Smoking status: Former Smoker -- 1.50 packs/day for 2 years    Types: Cigarettes    Quit date: 12/21/1977  . Smokeless tobacco: Never Used   . Alcohol Use: No  . Drug Use: No  . Sexual Activity: Yes   Other Topics Concern  . Not on file   Social History Narrative  . No narrative on file    Review of Systems:  All systems reviewed.  They are negative to the above problem except as previously stated.  Vital Signs: There were no vitals taken for this visit.  Physical Exam Patient is in NAD HEENT:  Normocephalic, atraumatic. EOMI, PERRLA.  Neck: JVP is normal.  No bruits.  Lungs: clear to auscultation. No rales no wheezes.  Heart: Regular rate and rhythm. Normal S1, S2. No S3.   No significant murmurs. PMI not displaced.  Abdomen:  Supple, nontender. Normal bowel sounds. No masses. No hepatomegaly.  Extremities:   Good distal pulses throughout. No lower extremity edema.  Musculoskeletal :moving all extremities.  Neuro:   alert and oriented x3.  CN II-XII grossly intact.  EKG  SB 52  T wave inversion III, AVF. Assessment and Plan:  1.  CAD  Recent intervention  Doing well  Continue to increase activity I am not convinced dyspnea related t ostent problem  [facility]  Will get TSH as well.  Set up for echo to eval diastolc properties of heart.  I would cut back on metoprolol to 1/2 bid    2.  HTN  FOllow  3.  Renal  Recehck labs.    4.  HL  Will need to check lipids when drawn next wk.     F/U in April.

## 2013-06-07 ENCOUNTER — Encounter (INDEPENDENT_AMBULATORY_CARE_PROVIDER_SITE_OTHER): Payer: Self-pay

## 2013-06-07 ENCOUNTER — Ambulatory Visit: Payer: Medicare Other | Admitting: Internal Medicine

## 2013-06-07 ENCOUNTER — Ambulatory Visit (INDEPENDENT_AMBULATORY_CARE_PROVIDER_SITE_OTHER): Payer: Medicare Other | Admitting: Internal Medicine

## 2013-06-07 ENCOUNTER — Encounter: Payer: Self-pay | Admitting: Internal Medicine

## 2013-06-07 VITALS — BP 115/73 | HR 67 | Ht 70.0 in | Wt 212.0 lb

## 2013-06-07 DIAGNOSIS — R0602 Shortness of breath: Secondary | ICD-10-CM

## 2013-06-07 DIAGNOSIS — E039 Hypothyroidism, unspecified: Secondary | ICD-10-CM

## 2013-06-07 DIAGNOSIS — I2510 Atherosclerotic heart disease of native coronary artery without angina pectoris: Secondary | ICD-10-CM

## 2013-06-07 DIAGNOSIS — I340 Nonrheumatic mitral (valve) insufficiency: Secondary | ICD-10-CM

## 2013-06-07 DIAGNOSIS — I059 Rheumatic mitral valve disease, unspecified: Secondary | ICD-10-CM

## 2013-06-07 MED ORDER — METOPROLOL TARTRATE 25 MG PO TABS: 12.5000 mg | ORAL_TABLET | Freq: Two times a day (BID) | ORAL | Status: DC

## 2013-06-07 NOTE — Patient Instructions (Addendum)
LAB WORK TODAY; TSH, LIPIDS  Your physician has requested that you have an echocardiogram. Echocardiography is a painless test that uses sound waves to create images of your heart. It provides your doctor with information about the size and shape of your heart and how well your heart's chambers and valves are working. This procedure takes approximately one hour. There are no restrictions for this procedure.  DECREASE METOPROLOL 12.5 MG TWICE DAILY  YOU HAVE BEEN GIVEN AN RX FOR LAB WORK TO BE DONE WITH DR. MATTINGLY; PLEASE HAVE RESULTS FAXED TO DR. PAULA ROSS [phone]

## 2013-06-11 ENCOUNTER — Encounter: Payer: Self-pay | Admitting: Internal Medicine

## 2013-06-26 ENCOUNTER — Telehealth: Payer: Self-pay | Admitting: *Deleted

## 2013-06-26 NOTE — Telephone Encounter (Signed)
Patient called for crestor and brilinta samples. I will place at the front desk for pick up.

## 2013-06-27 ENCOUNTER — Ambulatory Visit (HOSPITAL_COMMUNITY): Payer: Medicare Other | Attending: Internal Medicine | Admitting: Radiology

## 2013-06-27 DIAGNOSIS — R5381 Other malaise: Secondary | ICD-10-CM

## 2013-06-27 DIAGNOSIS — I059 Rheumatic mitral valve disease, unspecified: Secondary | ICD-10-CM | POA: Insufficient documentation

## 2013-06-27 DIAGNOSIS — R0602 Shortness of breath: Secondary | ICD-10-CM

## 2013-06-27 DIAGNOSIS — R5383 Other fatigue: Secondary | ICD-10-CM

## 2013-06-27 DIAGNOSIS — I2510 Atherosclerotic heart disease of native coronary artery without angina pectoris: Secondary | ICD-10-CM

## 2013-06-27 DIAGNOSIS — I340 Nonrheumatic mitral (valve) insufficiency: Secondary | ICD-10-CM

## 2013-06-27 NOTE — Progress Notes (Signed)
Echocardiogram performed.  

## 2013-07-22 ENCOUNTER — Other Ambulatory Visit (HOSPITAL_COMMUNITY): Payer: Self-pay | Admitting: Nurse Practitioner

## 2013-07-23 NOTE — Telephone Encounter (Signed)
Rx was sent to pharmacy electronically. 

## 2013-07-24 ENCOUNTER — Telehealth: Payer: Self-pay | Admitting: *Deleted

## 2013-07-24 NOTE — Telephone Encounter (Signed)
Crestor and brilinta samples placed at front desk for pick up.

## 2013-08-12 ENCOUNTER — Telehealth: Payer: Self-pay | Admitting: *Deleted

## 2013-08-12 NOTE — Telephone Encounter (Signed)
Patient calling for samples Brilinta 90mg  Twice daily, lot# [id number], Exp. 09/17, 8 bottles Crestor 10 mg daily, lot# [id number], exp. 2/18, 4 packs

## 2013-08-13 ENCOUNTER — Encounter: Payer: Self-pay | Admitting: Internal Medicine

## 2013-09-16 ENCOUNTER — Telehealth: Payer: Self-pay | Admitting: *Deleted

## 2013-09-16 NOTE — Telephone Encounter (Signed)
Crestor and brilinta samples placed at the front desk for pick up. 

## 2013-10-22 ENCOUNTER — Telehealth: Payer: Self-pay | Admitting: *Deleted

## 2013-10-22 NOTE — Telephone Encounter (Signed)
Crestor and brilinta samples placed at the front desk for pick up. 

## 2013-11-07 NOTE — Progress Notes (Signed)
HPI Patient is a 53 yo with a history of HTN, renal Tx.  Cath for progressive angina last fall.   It showed: 99% lesion in a large OM (L dominant system)  LAD and RCA normal Patient underwent PTCA/PROMUS stent placement I saw the patient in May 2015 Since then he has done [employer]  Remains active on farm.   Has good days where he is very active, may overdo it  Then he has to rest for a few days Denies CP     No Known Allergies  Current Outpatient Prescriptions  Medication Sig Dispense Refill  . Oxycodone HCl 10 MG TABS Take 10 mg by mouth 3 (three) times daily as needed.      Marland Kitchen. amLODipine (NORVASC) 10 MG tablet Take 10 mg by mouth daily.      Marland Kitchen. aspirin EC 81 MG tablet Take 81 mg by mouth daily.      . clonazePAM (KLONOPIN) 1 MG tablet Take 2 mg by mouth at bedtime.       . cyclobenzaprine (FLEXERIL) 10 MG tablet Take 10 mg by mouth 3 (three) times daily as needed (for pain).      . furosemide (LASIX) 20 MG tablet Take 20 mg by mouth daily.       Marland Kitchen. HYDROcodone-acetaminophen (NORCO/VICODIN) 5-325 MG per tablet Take 1 tablet by mouth every 6 (six) hours as needed (pain).      Marland Kitchen. levothyroxine (SYNTHROID, LEVOTHROID) 50 MCG tablet Take 50 mcg by mouth daily before breakfast.      . magnesium oxide (MAG-OX) 400 MG tablet Take 1,200 mg by mouth 2 (two) times daily. Take 2 hours before taking prograf and myfortic      . metoprolol tartrate (LOPRESSOR) 25 MG tablet Take 1 tablet (25 mg total) by mouth 2 (two) times daily.  60 tablet  5  . mycophenolate (MYFORTIC) 180 MG EC tablet Take 720 mg by mouth 2 (two) times daily.       . nitroGLYCERIN (NITROSTAT) 0.4 MG SL tablet Place 0.4 mg under the tongue every 5 (five) minutes as needed for chest pain.      Marland Kitchen. omeprazole (PRILOSEC) 20 MG capsule Take 20 mg by mouth daily.      . rosuvastatin (CRESTOR) 10 MG tablet Take 1 tablet (10 mg total) by mouth daily.  30 tablet  12  . tacrolimus (PROGRAF) 1 MG capsule Take 3 mg by mouth 2 (two) times daily.       .  Ticagrelor (BRILINTA) 90 MG TABS tablet Take 1 tablet (90 mg total) by mouth 2 (two) times daily.  60 tablet  6  . tiZANidine (ZANAFLEX) 4 MG tablet Take 4 mg by mouth at bedtime as needed for muscle spasms.      . Vitamin D, Ergocalciferol, (DRISDOL) 50000 UNITS CAPS capsule Take 1 tab every other week       No current facility-administered medications for this visit.    Past Medical History  Diagnosis Date  . Hypertension   . CKD (chronic kidney disease), stage III     a. s/p renal transplant 2012.  . Bulging lumbar disc     "I've got 5; they've done 3 injections then cut nerves last time" (12/26/2008)  . Hyperlipidemia   . Exertional shortness of breath     "just for the last 3 wks" (12/26/2012)  . Hypothyroidism   . GERD (gastroesophageal reflux disease)   . KZSWFUXN(235.5Headache(784.0)     "probably monthly" (12/26/2012)  . Arthritis     "  my back's eat up w/arthritis" (12/26/2012()  . Chronic radicular pain of lower back     "both legs" (12/26/2012)  . CAD (coronary artery disease)     a. 12/2012 Cath/PCI: LM nl, LAD nl, LCX dom, nl, OM1 99 (2.5x20 Promus DES), LPDA 20-30, RCA sm, nl, EF 55-65%.    Past Surgical History  Procedure Laterality Date  . Coronary angioplasty with stent placement  12/26/2012    "1" (12/26/2012)  . Kidney transplant  2012  . Renal biopsy, percutaneous Right ~ 2006  . Reattachment hand Right 1979; 1986    "middle finger; thumb"  . Orif tibia & fibula fractures Right 1990's    "titanium rod and 4 screws"    Family History  Problem Relation Age of Onset  . Diabetes Father   . Heart disease Father   . Hyperlipidemia Father     History   Social History  . Marital Status: Married    Spouse Name: N/A    Number of Children: N/A  . Years of Education: N/A   Occupational History  . Not on file.   Social History Main Topics  . Smoking status: Former Smoker -- 1.50 packs/day for 2 years    Types: Cigarettes    Quit date: 12/21/1977  . Smokeless  tobacco: Never Used  . Alcohol Use: No  . Drug Use: No  . Sexual Activity: Yes   Other Topics Concern  . Not on file   Social History Narrative  . No narrative on file    Review of Systems:  All systems reviewed.  They are negative to the above problem except as previously stated.  Vital Signs: BP 112/84  Pulse 66  Ht 5\' 10"  (1.778 m)  Wt 209 lb (94.802 kg)  BMI 29.99 kg/m2  Physical Exam Patient is in NAD HEENT:  Normocephalic, atraumatic. EOMI, PERRLA.  Neck: JVP is normal.  No bruits.  Lungs: clear to auscultation. No rales no wheezes.  Heart: Regular rate and rhythm. Normal S1, S2. No S3.   No significant murmurs. PMI not displaced.  Abdomen:  Supple, nontender. Normal bowel sounds. No masses. No hepatomegaly.  Extremities:   Good distal pulses throughout. No lower extremity edema.  Musculoskeletal :moving all extremities.  Neuro:   alert and oriented x3.  CN II-XII grossly intact.  EKG  SR 66 bpm   Assessment and Plan:  1.  CAD  Doing well  I would keep on current regimen    2.  HTN  BP is good  Keep on current regimen    3.  Renal  Seen by renal yesterday  Wll get labs   4.  HL  LDL had been high  Now on crestor  Will add to labs that were drawn yesterday at [facility]    F/U in spring.     F/U in April.

## 2013-11-08 ENCOUNTER — Encounter: Payer: Self-pay | Admitting: Internal Medicine

## 2013-11-08 ENCOUNTER — Ambulatory Visit (INDEPENDENT_AMBULATORY_CARE_PROVIDER_SITE_OTHER): Payer: Medicare Other | Admitting: Internal Medicine

## 2013-11-08 VITALS — BP 112/84 | HR 66 | Ht 70.0 in | Wt 209.0 lb

## 2013-11-08 DIAGNOSIS — I2510 Atherosclerotic heart disease of native coronary artery without angina pectoris: Secondary | ICD-10-CM

## 2013-11-08 DIAGNOSIS — E785 Hyperlipidemia, unspecified: Secondary | ICD-10-CM

## 2013-11-08 NOTE — Patient Instructions (Signed)
Your physician recommends that you continue on your current medications as directed. Please refer to the Current Medication list given to you today. Your physician wants you to follow-up in: 6 months with Dr. Ross.  You will receive a reminder letter in the mail two months in advance. If you don't receive a letter, please call our office to schedule the follow-up appointment.  

## 2013-11-13 ENCOUNTER — Encounter: Payer: Self-pay | Admitting: Internal Medicine

## 2013-12-16 ENCOUNTER — Telehealth: Payer: Self-pay | Admitting: *Deleted

## 2013-12-16 NOTE — Telephone Encounter (Signed)
Crestor and brilinta samples placed at the front desk for patient. 

## 2013-12-26 ENCOUNTER — Encounter (HOSPITAL_COMMUNITY): Payer: Self-pay | Admitting: Cardiology

## 2014-01-13 ENCOUNTER — Telehealth: Payer: Self-pay | Admitting: *Deleted

## 2014-01-13 NOTE — Telephone Encounter (Signed)
OK to stop Bilinita  Stay on aspirin 81 mg His BP was good in clinic on metoprolol  If stop would need to follow BP closely to make sure doesn't get too high.

## 2014-01-14 NOTE — Telephone Encounter (Signed)
Message from Reesa Cheworothea G Jones, CMA sent at 01/13/2014 11:12 AM EST -----   Robert PigeonJerry Walton wants to know if you are going to take him off of Brilinta and metoprolol after 12 months per your last conversation with him  See response below from Dr. Tenny Crawoss.  I spoke with pt and gave him this information. He has blood pressure cuff and will monitor blood pressure at home. He will call if he notices blood pressure starts going up.

## 2014-02-10 ENCOUNTER — Telehealth: Payer: Self-pay | Admitting: *Deleted

## 2014-02-10 NOTE — Telephone Encounter (Signed)
Crestor samples provided for patient. 

## 2014-03-18 ENCOUNTER — Encounter: Payer: Self-pay | Admitting: Internal Medicine

## 2014-05-12 ENCOUNTER — Encounter: Payer: Self-pay | Admitting: Internal Medicine

## 2014-05-12 ENCOUNTER — Ambulatory Visit (INDEPENDENT_AMBULATORY_CARE_PROVIDER_SITE_OTHER): Payer: Medicare Other | Admitting: Internal Medicine

## 2014-05-12 VITALS — BP 116/86 | HR 71 | Ht 70.0 in | Wt 212.4 lb

## 2014-05-12 DIAGNOSIS — E785 Hyperlipidemia, unspecified: Secondary | ICD-10-CM

## 2014-05-12 NOTE — Patient Instructions (Signed)
Medication Instructions:  NO CHANGE IN MEDICATIONS  Labwork: PLEASE HAVE YOUR LABS DRAWN AT DIALYSIS, RESULTS TO BE SENT TO DR. ROSS  Testing/Procedures: NONE  Follow-Up: Your physician wants you to follow-up in: November, 2016 WITH DR ROSS.  You will receive a reminder letter in the mail two months in advance. If you don't receive a letter, please call our office to schedule the follow-up appointment.   Any Other Special Instructions Will Be Listed Below (If Applicable).

## 2014-05-12 NOTE — Progress Notes (Signed)
Cardiology Office Note   Date:  05/12/2014   ID:  Robert Walton, DOB [birth year], MRN [id number]  PCP:  Abelina Bachelor, MD  Cardiologist:   Dietrich Pates, MD   No chief complaint on file.  F/u of CAD     History of Present Illness: Robert Walton is a 54 y.o. male with a history o HTN, renal Tx. Cath for progressive angina last fall. It showed: 99% lesion in a large OM (L dominant system) LAD and RCA normal Patient underwent PTCA/PROMUS stent placement I saw the patient in Octobet  Last LDL was 53 in October 2015  Patient had shingles that begain about 6 wks ago  R arm area  Still recovering  Denies L sided CP  Says he fatigues easily but this has been like this since Tx in 2012  No change  No worse     Current Outpatient Prescriptions  Medication Sig Dispense Refill  . amLODipine (NORVASC) 10 MG tablet Take 10 mg by mouth daily.    Marland Kitchen aspirin EC 81 MG tablet Take 81 mg by mouth daily.    . clonazePAM (KLONOPIN) 1 MG tablet Take 2 mg by mouth at bedtime.     . cyclobenzaprine (FLEXERIL) 10 MG tablet Take 10 mg by mouth 3 (three) times daily as needed (for pain).    . furosemide (LASIX) 20 MG tablet Take 20 mg by mouth daily.     Marland Kitchen levothyroxine (SYNTHROID, LEVOTHROID) 50 MCG tablet Take 50 mcg by mouth daily before breakfast.    . magnesium oxide (MAG-OX) 400 MG tablet Take 1,200 mg by mouth 2 (two) times daily. Take 2 hours before taking prograf and myfortic    . mycophenolate (MYFORTIC) 180 MG EC tablet Take 720 mg by mouth 2 (two) times daily.     . nitroGLYCERIN (NITROSTAT) 0.4 MG SL tablet Place 0.4 mg under the tongue every 5 (five) minutes as needed for chest pain.    Marland Kitchen omeprazole (PRILOSEC) 20 MG capsule Take 20 mg by mouth daily.    . Oxycodone HCl 10 MG TABS Take 10 mg by mouth 3 (three) times daily as needed (for pain).     . rosuvastatin (CRESTOR) 10 MG tablet Take 1 tablet (10 mg total) by mouth daily. 30 tablet 12  . tacrolimus (PROGRAF) 1 MG capsule Take 3 mg  by mouth 2 (two) times daily.     Marland Kitchen tiZANidine (ZANAFLEX) 4 MG tablet Take 4 mg by mouth at bedtime as needed for muscle spasms.    . Vitamin D, Cholecalciferol, 1000 UNITS CAPS Take 2,000 mg by mouth daily.     No current facility-administered medications for this visit.    Allergies:   Codeine   Past Medical History  Diagnosis Date  . Hypertension   . CKD (chronic kidney disease), stage III     a. s/p renal transplant 2012.  . Bulging lumbar disc     "I've got 5; they've done 3 injections then cut nerves last time" (12/26/2008)  . Hyperlipidemia   . Exertional shortness of breath     "just for the last 3 wks" (12/26/2012)  . Hypothyroidism   . GERD (gastroesophageal reflux disease)   . WUJWJXBJ(478.2)     "probably monthly" (12/26/2012)  . Arthritis     "my back's eat up w/arthritis" (12/26/2012()  . Chronic radicular pain of lower back     "both legs" (12/26/2012)  . CAD (coronary artery disease)     a.  12/2012 Cath/PCI: LM nl, LAD nl, LCX dom, nl, OM1 99 (2.5x20 Promus DES), LPDA 20-30, RCA sm, nl, EF 55-65%.    Past Surgical History  Procedure Laterality Date  . Coronary angioplasty with stent placement  12/26/2012    "1" (12/26/2012)  . Kidney transplant  2012  . Renal biopsy, percutaneous Right ~ 2006  . Reattachment hand Right 1979; 1986    "middle finger; thumb"  . Orif tibia & fibula fractures Right 1990's    "titanium rod and 4 screws"  . Left heart catheterization with coronary angiogram N/A 12/26/2012    Procedure: LEFT HEART CATHETERIZATION WITH CORONARY ANGIOGRAM;  Surgeon: Peter M SwazilandJordan, MD;  [facility];  Service: Cardiovascular;  Laterality: N/A;     Social History:  The patient  reports that he quit smoking about 36 years ago. His smoking use included Cigarettes. He has a 3 pack-year smoking history. He has never used smokeless tobacco. He reports that he does not drink alcohol or use illicit drugs.   Family History:  The patient's family  history includes Diabetes in his father; Heart disease in his father; Hyperlipidemia in his father.    ROS:  Please see the history of present illness. All other systems are reviewed and  Negative to the above problem except as noted.    PHYSICAL EXAM: VS:  BP 116/86 mmHg  Pulse 71  Ht 5\' 10"  (1.778 m)  Wt 212 lb 6.4 oz (96.344 kg)  BMI 30.48 kg/m2  GEN: Well nourished, well developed, in no acute distress HEENT: normal Neck: no JVD, carotid bruits, or masses Cardiac: RRR; no murmurs, rubs, or gallops,no edema  Respiratory:  clear to auscultation bilaterally, normal work of breathing GI: soft, nontender, nondistended, + BS  No hepatomegaly  MS: no deformity Moving all extremities   Skin: warm and dry, no rash Neuro:  Strength and sensation are intact Psych: euthymic mood, full affect   EKG:  EKG is not ordered today.   Lipid Panel    Component Value Date/Time   CHOL 166 01/07/2013 1121   TRIG 172.0* 01/07/2013 1121   HDL 36.10* 01/07/2013 1121   CHOLHDL 5 01/07/2013 1121   VLDL 34.4 01/07/2013 1121   LDLCALC 96 01/07/2013 1121      Wt Readings from Last 3 Encounters:  05/12/14 212 lb 6.4 oz (96.344 kg)  11/08/13 209 lb (94.802 kg)  06/07/13 212 lb (96.163 kg)      ASSESSMENT AND PLAN:  1.  CAD  No angina  I am not convinced fatigue is due to worsening CAD   Review of cath OM was only vessel with signif dz  Other vessels OK Keep on same regimen  Stay active  2.  HL  Lipids under excellent control  3.  HTN  Good control  4.  Renal  Follows in Tx clinic       Current medicines are reviewed at length with the patient today.  The patient does not have concerns regarding medicines.  The following changes have been made:   Disposition:   FU with me in 6 months    Signed, Dietrich PatesPaula Aneth Schlagel, MD  05/12/2014 8:12 AM    [facility] [address], [place], [place]  [address] Phone: [phone]([phone]; Fax: [phone]

## 2014-11-04 ENCOUNTER — Other Ambulatory Visit: Payer: Self-pay | Admitting: Internal Medicine

## 2014-11-05 LAB — LIPID PANEL W/O CHOL/HDL RATIO
Cholesterol, Total: 240 mg/dL — ABNORMAL HIGH (ref 100–199)
HDL: 40 mg/dL (ref >39)
LDL Calculated: 135 mg/dL — ABNORMAL HIGH (ref 0–99)
TRIGLYCERIDES: 324 mg/dL — AB (ref 0–149)
VLDL Cholesterol Cal: 65 mg/dL — ABNORMAL HIGH (ref 5–40)

## 2014-11-07 ENCOUNTER — Telehealth: Payer: Self-pay | Admitting: *Deleted

## 2014-11-07 MED ORDER — PITAVASTATIN CALCIUM 1 MG PO TABS: 1.0000 mg | ORAL_TABLET | Freq: Every day | ORAL | Status: DC

## 2014-11-07 NOTE — Telephone Encounter (Signed)
Per Dr. Tenny Crawoss-  Give pt samples of Livalo 1 mg     Crestor should be generic in January   Placing samples at front desk. Informed patient. Will pick it up on Monday.

## 2014-11-14 ENCOUNTER — Telehealth: Payer: Self-pay | Admitting: *Deleted

## 2014-11-14 NOTE — Telephone Encounter (Signed)
Received request for cardiac clearance and approval to come off his asa--[facility]-Dr. Rockne MenghiniMichaux Kilpatrick. Placed in Dr. Charlott Rakesoss's folder to review.

## 2014-11-16 NOTE — Progress Notes (Signed)
Cardiology Office Note   Date:  11/17/2014   ID:  Robert Walton, DOB February 09, 1960, MRN [id number]  PCP:  Abelina Bachelor, MD  Cardiologist:   Dietrich Pates, MD   No chief complaint on file.     History of Present Illness: Robert Walton is a 54 y.o. male with a history ofHTN, renal Tx. Cath for progressive angina last fall. It showed: 99% lesion in a large OM (L dominant system) LAD and RCA normal Patient underwent PTCA/PROMUS stent placement I saw the patient in April 2016  No CP  Recently recovering from URI Cough prod of clear sputum now Endurance still not good.    Current Outpatient Prescriptions  Medication Sig Dispense Refill  . amLODipine (NORVASC) 10 MG tablet Take 10 mg by mouth daily.    Marland Kitchen aspirin EC 81 MG tablet Take 81 mg by mouth daily.    . clonazePAM (KLONOPIN) 1 MG tablet Take 2 mg by mouth at bedtime.     . cyclobenzaprine (FLEXERIL) 10 MG tablet Take 10 mg by mouth 3 (three) times daily as needed (for pain).    . furosemide (LASIX) 20 MG tablet Take 20 mg by mouth daily.     Marland Kitchen levothyroxine (SYNTHROID, LEVOTHROID) 50 MCG tablet Take 50 mcg by mouth daily before breakfast.    . magnesium oxide (MAG-OX) 400 MG tablet Take 1,200 mg by mouth 2 (two) times daily. Take 2 hours before taking prograf and myfortic    . mycophenolate (MYFORTIC) 180 MG EC tablet Take 720 mg by mouth 2 (two) times daily.     . nitroGLYCERIN (NITROSTAT) 0.4 MG SL tablet Place 0.4 mg under the tongue every 5 (five) minutes as needed for chest pain.    Marland Kitchen omeprazole (PRILOSEC) 20 MG capsule Take 20 mg by mouth daily.    . Oxycodone HCl 10 MG TABS Take 10 mg by mouth 4 (four) times daily as needed. (PAIN)  0  . Pitavastatin Calcium 1 MG TABS Take 1 tablet (1 mg total) by mouth daily. 30 tablet   . tacrolimus (PROGRAF) 1 MG capsule Take 3 mg by mouth 2 (two) times daily.     Marland Kitchen tiZANidine (ZANAFLEX) 4 MG tablet Take 4 mg by mouth at bedtime as needed for muscle spasms.    . Vitamin D,  Cholecalciferol, 1000 UNITS CAPS Take 2,000 mg by mouth daily.     No current facility-administered medications for this visit.    Allergies:   Codeine   Past Medical History  Diagnosis Date  . Hypertension   . CKD (chronic kidney disease), stage III     a. s/p renal transplant 2012.  . Bulging lumbar disc     "I've got 5; they've done 3 injections then cut nerves last time" (12/26/2008)  . Hyperlipidemia   . Exertional shortness of breath     "just for the last 3 wks" (12/26/2012)  . Hypothyroidism   . GERD (gastroesophageal reflux disease)   . NWGNFAOZ(308.6)     "probably monthly" (12/26/2012)  . Arthritis     "my back's eat up w/arthritis" (12/26/2012()  . Chronic radicular pain of lower back     "both legs" (12/26/2012)  . CAD (coronary artery disease)     a. 12/2012 Cath/PCI: LM nl, LAD nl, LCX dom, nl, OM1 99 (2.5x20 Promus DES), LPDA 20-30, RCA sm, nl, EF 55-65%.    Past Surgical History  Procedure Laterality Date  . Coronary angioplasty with stent placement  12/26/2012    "  1" (12/26/2012)  . Kidney transplant  2012  . Renal biopsy, percutaneous Right ~ 2006  . Reattachment hand Right 1979; 1986    "middle finger; thumb"  . Orif tibia & fibula fractures Right 1990's    "titanium rod and 4 screws"  . Left heart catheterization with coronary angiogram N/A 12/26/2012    Procedure: LEFT HEART CATHETERIZATION WITH CORONARY ANGIOGRAM;  Surgeon: Peter M SwazilandJordan, MD;  [facility];  Service: Cardiovascular;  Laterality: N/A;     Social History:  The patient  reports that he quit smoking about 36 years ago. His smoking use included Cigarettes. He has a 3 pack-year smoking history. He has never used smokeless tobacco. He reports that he does not drink alcohol or use illicit drugs.   Family History:  The patient's family history includes Diabetes in his father; Heart disease in his father; Hyperlipidemia in his father.    ROS:  Please see the history of present  illness. All other systems are reviewed and  Negative to the above problem except as noted.    PHYSICAL EXAM: VS:  BP 132/84 mmHg  Pulse 83  Ht 5\' 10"  (1.778 m)  Wt 211 lb 1.9 oz (95.763 kg)  BMI 30.29 kg/m2  SpO2 97%  GEN: Well nourished, well developed, in no acute distress HEENT: normal Neck: no JVD, carotid bruits, or masses Cardiac: RRR; no murmurs, rubs, or gallops,no edema  Respiratory:  Rhonchi  Clear with cough.  GI: soft, nontender, nondistended, + BS  No hepatomegaly  MS: no deformity Moving all extremities   Skin: warm and dry, no rash Neuro:  Strength and sensation are intact Psych: euthymic mood, full affect   EKG:  EKG is ordered today.  SR 83 bpm     Lipid Panel    Component Value Date/Time   CHOL 240* 11/04/2014 0817   CHOL 166 01/07/2013 1121   TRIG 324* 11/04/2014 0817   HDL 40 11/04/2014 0817   HDL 36.10* 01/07/2013 1121   CHOLHDL 5 01/07/2013 1121   VLDL 34.4 01/07/2013 1121   LDLCALC 135* 11/04/2014 0817   LDLCALC 96 01/07/2013 1121      Wt Readings from Last 3 Encounters:  11/17/14 211 lb 1.9 oz (95.763 kg)  05/12/14 212 lb 6.4 oz (96.344 kg)  11/08/13 209 lb (94.802 kg)      ASSESSMENT AND PLAN:  1  CAD  Doing fair  Still gives out  Will check labs   2   HTN  Fair  WIll need to be followed  Check BMET  3.  HL  Got samples of Livalo last week  Will check lipids in December  Cant afford Crstor now.  4.  URI  Recovering  Recom Duratuss   Signed, Dietrich PatesPaula Ross, MD  11/17/2014 8:46 AM    [facility] [address], [place], [place]  [address] Phone: [phone](336) [phone]; Fax: [phone](336) [phone]

## 2014-11-17 ENCOUNTER — Encounter: Payer: Self-pay | Admitting: Internal Medicine

## 2014-11-17 ENCOUNTER — Ambulatory Visit (INDEPENDENT_AMBULATORY_CARE_PROVIDER_SITE_OTHER): Payer: Medicare Other | Admitting: Internal Medicine

## 2014-11-17 VITALS — BP 132/84 | HR 83 | Ht 70.0 in | Wt 211.1 lb

## 2014-11-17 DIAGNOSIS — R0602 Shortness of breath: Secondary | ICD-10-CM | POA: Diagnosis not present

## 2014-11-17 DIAGNOSIS — E785 Hyperlipidemia, unspecified: Secondary | ICD-10-CM

## 2014-11-17 LAB — BASIC METABOLIC PANEL
BUN: 12 mg/dL (ref 7–25)
CALCIUM: 9.4 mg/dL (ref 8.6–10.3)
CHLORIDE: 99 mmol/L (ref 98–110)
CO2: 27 mmol/L (ref 20–31)
CREATININE: 1.47 mg/dL — AB (ref 0.70–1.33)
Glucose, Bld: 102 mg/dL — ABNORMAL HIGH (ref 65–99)
Potassium: 3.5 mmol/L (ref 3.5–5.3)
Sodium: 136 mmol/L (ref 135–146)

## 2014-11-17 NOTE — Patient Instructions (Addendum)
Medication Instructions:  Your physician recommends that you continue on your current medications as directed. Please refer to the Current Medication list given to you today.   Labwork: Your physician recommends that you return for lab work TODAY (BMET)  Your physician recommends that you return for lab work in: End of December (Lipid/Liver)   Testing/Procedures: none  Follow-Up: Your physician wants you to follow-up in: 6 months with Dr. Tenny Crawoss. You will receive a reminder letter in the mail two months in advance. If you don't receive a letter, please call our office to schedule the follow-up appointment.   Any Other Special Instructions Will Be Listed Below (If Applicable).     If you need a refill on your cardiac medications before your next appointment, please call your pharmacy.  Pt provided 1 months worth of samples of Livalo 2 mg tablets and knows to cut them in half to last one month.

## 2014-12-04 ENCOUNTER — Telehealth: Payer: Self-pay | Admitting: *Deleted

## 2014-12-04 NOTE — Telephone Encounter (Signed)
Received updated clearance request for patient; note stating "Dr. Petra KubaKilpatrick requested additional clearance given he is being actively treated for what he describes is bronchitis"   signed by Dr. Tenny Crawoss "pt ok to proceed w/ surgery from cardiac standpoint"    Placed in Nurse fax bin in medical records to be faxed.

## 2014-12-22 ENCOUNTER — Other Ambulatory Visit: Payer: Self-pay | Admitting: Internal Medicine

## 2014-12-22 NOTE — Telephone Encounter (Signed)
See note from pharmacy that patient would like to go back on this medication.

## 2014-12-24 NOTE — Telephone Encounter (Signed)
OK to start back. 

## 2014-12-24 NOTE — Telephone Encounter (Signed)
Pt had been on Crestor until October.  Was switched to Livalo and provided samples due to cost of Crestor.  It has since become more affordable for him.  I advised him to finish his samples and start Crestor. New prescription sent to pharmacy

## 2015-01-16 ENCOUNTER — Other Ambulatory Visit (INDEPENDENT_AMBULATORY_CARE_PROVIDER_SITE_OTHER): Payer: Medicare Other

## 2015-01-16 DIAGNOSIS — E785 Hyperlipidemia, unspecified: Secondary | ICD-10-CM | POA: Diagnosis not present

## 2015-01-16 DIAGNOSIS — R0602 Shortness of breath: Secondary | ICD-10-CM | POA: Diagnosis not present

## 2015-01-16 NOTE — Addendum Note (Signed)
Addended by: Tonita PhoenixBOWDEN, ROBIN K on: 01/16/2015 08:26 AM   Modules accepted: Orders

## 2015-07-20 ENCOUNTER — Ambulatory Visit (INDEPENDENT_AMBULATORY_CARE_PROVIDER_SITE_OTHER): Payer: Medicare Other | Admitting: Internal Medicine

## 2015-07-20 ENCOUNTER — Encounter: Payer: Self-pay | Admitting: Internal Medicine

## 2015-07-20 VITALS — BP 118/76 | HR 79 | Ht 70.0 in | Wt 215.2 lb

## 2015-07-20 DIAGNOSIS — G4710 Hypersomnia, unspecified: Secondary | ICD-10-CM

## 2015-07-20 DIAGNOSIS — R400 Somnolence: Secondary | ICD-10-CM

## 2015-07-20 NOTE — Patient Instructions (Signed)
Your physician recommends that you continue on your current medications as directed. Please refer to the Current Medication list given to you today.  Your physician has recommended that you have a sleep study. This test records several body functions during sleep, including: brain activity, eye movement, oxygen and carbon dioxide blood levels, heart rate and rhythm, breathing rate and rhythm, the flow of air through your mouth and nose, snoring, body muscle movements, and chest and belly movement.   

## 2015-07-20 NOTE — Progress Notes (Signed)
Cardiology Office Note   Date:  07/20/2015   ID:  Robert BarmanJerry E Koelzer, DOB 08/12/1960, MRN [id number]  PCP:  Abelina Bachelorean, Louis Alan, MD  Cardiologist:   Dietrich PatesPaula Jaqualyn Juday, MD   F/7u of CAD    History of Present Illness: Robert Walton is a 55 y.o. male with a history ofofHTN, renal Tx. Cath for progressive angina last fall. It showed: 99% lesion in a large OM (L dominant system) LAD and RCA normal Patient underwent PTCA/PROMUS stent placement  I saw her in October 2016  Since seen says no CP  No wheezing Cant do what he used to be able to do  [employer] Corporation[employer]orn  Out  Stays tired all the time  No PND   Seen in renal clinic recently  Briant Cedar( [place])  Labs done       Outpatient Prescriptions Prior to Visit  Medication Sig Dispense Refill  . amLODipine (NORVASC) 10 MG tablet Take 10 mg by mouth daily.    Marland Kitchen. aspirin EC 81 MG tablet Take 81 mg by mouth daily.    . clonazePAM (KLONOPIN) 1 MG tablet Take 2 mg by mouth at bedtime.     . CRESTOR 10 MG tablet TAKE 1 TABLET BY MOUTH EVERY DAY 30 tablet 6  . cyclobenzaprine (FLEXERIL) 10 MG tablet Take 10 mg by mouth 3 (three) times daily as needed (for pain).    . furosemide (LASIX) 20 MG tablet Take 20 mg by mouth daily.     Marland Kitchen. levothyroxine (SYNTHROID, LEVOTHROID) 50 MCG tablet Take 50 mcg by mouth daily before breakfast.    . magnesium oxide (MAG-OX) 400 MG tablet Take 1,200 mg by mouth 2 (two) times daily. Take 2 hours before taking prograf and myfortic    . mycophenolate (MYFORTIC) 180 MG EC tablet Take 720 mg by mouth 2 (two) times daily.     . nitroGLYCERIN (NITROSTAT) 0.4 MG SL tablet Place 0.4 mg under the tongue every 5 (five) minutes as needed for chest pain.    Marland Kitchen. omeprazole (PRILOSEC) 20 MG capsule Take 20 mg by mouth daily.    . Oxycodone HCl 10 MG TABS Take 10 mg by mouth 4 (four) times daily as needed. (PAIN)  0  . tacrolimus (PROGRAF) 1 MG capsule Take 3 mg by mouth 2 (two) times daily.     Marland Kitchen. tiZANidine (ZANAFLEX) 4 MG tablet Take 4 mg by mouth at  bedtime as needed for muscle spasms.    . Vitamin D, Cholecalciferol, 1000 UNITS CAPS Take 2,000 mg by mouth daily.     No facility-administered medications prior to visit.     Allergies:   Codeine and Gabapentin   Past Medical History  Diagnosis Date  . Hypertension   . CKD (chronic kidney disease), stage III     a. s/p renal transplant 2012.  . Bulging lumbar disc     "I've got 5; they've done 3 injections then cut nerves last time" (12/26/2008)  . Hyperlipidemia   . Exertional shortness of breath     "just for the last 3 wks" (12/26/2012)  . Hypothyroidism   . GERD (gastroesophageal reflux disease)   . WUJWJXBJ(478.2Headache(784.0)     "probably monthly" (12/26/2012)  . Arthritis     "my back's eat up w/arthritis" (12/26/2012()  . Chronic radicular pain of lower back     "both legs" (12/26/2012)  . CAD (coronary artery disease)     a. 12/2012 Cath/PCI: LM nl, LAD nl, LCX dom, nl, OM1 99 (2.5x20  Promus DES), LPDA 20-30, RCA sm, nl, EF 55-65%.    Past Surgical History  Procedure Laterality Date  . Coronary angioplasty with stent placement  12/26/2012    "1" (12/26/2012)  . Kidney transplant  2012  . Renal biopsy, percutaneous Right ~ 2006  . Reattachment hand Right 1979; 1986    "middle finger; thumb"  . Orif tibia & fibula fractures Right 1990's    "titanium rod and 4 screws"  . Left heart catheterization with coronary angiogram N/A 12/26/2012    Procedure: LEFT HEART CATHETERIZATION WITH CORONARY ANGIOGRAM;  Surgeon: Peter M SwazilandJordan, MD;  [facility];  Service: Cardiovascular;  Laterality: N/A;     Social History:  The patient  reports that he quit smoking about 37 years ago. His smoking use included Cigarettes. He has a 3 pack-year smoking history. He has never used smokeless tobacco. He reports that he does not drink alcohol or use illicit drugs.   Family History:  The patient's family history includes Diabetes in his father; Heart disease in his father; Hyperlipidemia  in his father.    ROS:  Please see the history of present illness. All other systems are reviewed and  Negative to the above problem except as noted.    PHYSICAL EXAM: VS:  BP 118/76 mmHg  Pulse 79  Ht 5\' 10"  (1.778 m)  Wt 215 lb 3.2 oz (97.614 kg)  BMI 30.88 kg/m2  SpO2 96%  GEN: Well nourished, well developed, in no acute distress HEENT: normal Neck: no JVD, carotid bruits, or masses Cardiac: RRR; no murmurs, rubs, or gallops,no edema  Respiratory:  clear to auscultation bilaterally, normal work of breathing GI: soft, nontender, nondistended, + BS  No hepatomegaly  MS: no deformity Moving all extremities   Skin: warm and dry, no rash Neuro:  Strength and sensation are intact Psych: euthymic mood, full affect   EKG:  EKG is not ordered today.   Lipid Panel    Component Value Date/Time   CHOL 240* 11/04/2014 0817   CHOL 166 01/07/2013 1121   TRIG 324* 11/04/2014 0817   HDL 40 11/04/2014 0817   HDL 36.10* 01/07/2013 1121   CHOLHDL 5 01/07/2013 1121   VLDL 34.4 01/07/2013 1121   LDLCALC 135* 11/04/2014 0817   LDLCALC 96 01/07/2013 1121      Wt Readings from Last 3 Encounters:  07/20/15 215 lb 3.2 oz (97.614 kg)  11/17/14 211 lb 1.9 oz (95.763 kg)  05/12/14 212 lb 6.4 oz (96.344 kg)      ASSESSMENT AND PLAN: 1  CAD No CP  He has never had though  Just giving out   He has chronic fatigue  I would recomm sleep study  If negative then would set up for myoview  Per pt Dr Briant CedarMattingly said he should never go on treadmill  Will reivew if plan on this    2  HTN  Adequate control  Will get labs from renal cllnic   3  HL  On statin  Will get labs from renal    Tentative f/u in 6 months       Signed, Dietrich PatesPaula Clever Geraldo, MD  07/20/2015 8:51 AM    [facility] [address], [place], [place]  [address] Phone: [phone](336) [phone]; Fax: [phone]

## 2015-07-24 ENCOUNTER — Other Ambulatory Visit: Payer: Self-pay

## 2015-09-19 ENCOUNTER — Other Ambulatory Visit: Payer: Self-pay | Admitting: Internal Medicine

## 2016-02-19 ENCOUNTER — Other Ambulatory Visit: Payer: Self-pay | Admitting: Internal Medicine

## 2016-03-24 ENCOUNTER — Other Ambulatory Visit: Payer: Self-pay | Admitting: Internal Medicine

## 2016-06-30 ENCOUNTER — Other Ambulatory Visit: Payer: Self-pay | Admitting: Internal Medicine

## 2016-08-31 ENCOUNTER — Other Ambulatory Visit: Payer: Self-pay | Admitting: Internal Medicine

## 2016-09-30 ENCOUNTER — Ambulatory Visit
Admission: RE | Admit: 2016-09-30 | Discharge: 2016-09-30 | Disposition: A | Discharge status: Home / Self Care | Payer: Medicare Other | Source: Ambulatory Visit | Attending: Nephrology | Admitting: Nephrology

## 2016-09-30 ENCOUNTER — Other Ambulatory Visit: Payer: Self-pay | Admitting: Nephrology

## 2016-09-30 DIAGNOSIS — T8619 Other complication of kidney transplant: Secondary | ICD-10-CM

## 2016-09-30 DIAGNOSIS — R109 Unspecified abdominal pain: Secondary | ICD-10-CM

## 2016-09-30 DIAGNOSIS — N23 Unspecified renal colic: Secondary | ICD-10-CM

## 2016-12-30 ENCOUNTER — Other Ambulatory Visit: Payer: Self-pay | Admitting: Internal Medicine

## 2017-02-14 ENCOUNTER — Other Ambulatory Visit: Payer: Self-pay | Admitting: Internal Medicine

## 2017-03-08 ENCOUNTER — Other Ambulatory Visit: Payer: Self-pay | Admitting: Internal Medicine

## 2017-03-08 NOTE — Telephone Encounter (Signed)
Please advise as patient has been through 3rd/final attempt and still has not scheduled a follow up appointment. Thanks, MI

## 2017-03-09 NOTE — Telephone Encounter (Signed)
Scheduled patient for appointment tomorrow with Dr. Tenny Crawoss.  Will refill at that time.

## 2017-03-10 ENCOUNTER — Encounter: Payer: Self-pay | Admitting: Internal Medicine

## 2017-03-10 ENCOUNTER — Encounter (INDEPENDENT_AMBULATORY_CARE_PROVIDER_SITE_OTHER): Payer: Self-pay

## 2017-03-10 ENCOUNTER — Ambulatory Visit (INDEPENDENT_AMBULATORY_CARE_PROVIDER_SITE_OTHER): Payer: Medicare Other | Admitting: Internal Medicine

## 2017-03-10 VITALS — BP 132/78 | HR 82 | Ht 70.0 in | Wt 212.8 lb

## 2017-03-10 DIAGNOSIS — I10 Essential (primary) hypertension: Secondary | ICD-10-CM | POA: Diagnosis not present

## 2017-03-10 DIAGNOSIS — G4730 Sleep apnea, unspecified: Secondary | ICD-10-CM

## 2017-03-10 DIAGNOSIS — I2510 Atherosclerotic heart disease of native coronary artery without angina pectoris: Secondary | ICD-10-CM | POA: Diagnosis not present

## 2017-03-10 DIAGNOSIS — E782 Mixed hyperlipidemia: Secondary | ICD-10-CM

## 2017-03-10 MED ORDER — ROSUVASTATIN CALCIUM 10 MG PO TABS: 10.0000 mg | ORAL_TABLET | Freq: Every day | ORAL | 11 refills | Status: DC

## 2017-03-10 NOTE — Progress Notes (Signed)
Cardiology Office Note   Date:  03/10/2017   ID:  Robert BarmanJerry E Chovanec, DOB 03/17/1960, MRN [id number]  PCP:  Abelina Bachelorean, Louis Alan, MD  Cardiologist:   Dietrich PatesPaula Walker Paddack, MD   F/7u of CAD    History of Present Illness: Robert Walton is a 57 y.o. male with a history of HTN, renal Tx. And CAD  PT had cath om 2014 'that showed 99% lesion in a large OM (L dominant system) LAD and RCA normal Patient underwent PTCA/PROMUS stent placement  I saw him last in clinic 2 years ago   SInce then he has followed in renal clinic   He says his breathing is OK  He denies CP   No SOB     Outpatient Medications Prior to Visit  Medication Sig Dispense Refill  . amLODipine (NORVASC) 10 MG tablet Take 10 mg by mouth daily.    Marland Kitchen. aspirin EC 81 MG tablet Take 81 mg by mouth daily.    . cyclobenzaprine (FLEXERIL) 10 MG tablet Take 10 mg by mouth 3 (three) times daily as needed (for pain).    . furosemide (LASIX) 20 MG tablet Take 20 mg by mouth daily.     Marland Kitchen. levothyroxine (SYNTHROID, LEVOTHROID) 50 MCG tablet Take 50 mcg by mouth daily before breakfast.    . magnesium oxide (MAG-OX) 400 MG tablet Take 1,200 mg by mouth 2 (two) times daily. Take 2 hours before taking prograf and myfortic    . mycophenolate (MYFORTIC) 180 MG EC tablet Take 720 mg by mouth 2 (two) times daily.     . nitroGLYCERIN (NITROSTAT) 0.4 MG SL tablet Place 0.4 mg under the tongue every 5 (five) minutes as needed for chest pain.    Marland Kitchen. omeprazole (PRILOSEC) 20 MG capsule Take 20 mg by mouth daily.    . Oxycodone HCl 10 MG TABS Take 10 mg by mouth 4 (four) times daily as needed. (PAIN)  0  . rosuvastatin (CRESTOR) 10 MG tablet take 1 tablet by mouth DAILY 30 tablet 0  . tacrolimus (PROGRAF) 1 MG capsule Take 3 mg by mouth 2 (two) times daily.     Marland Kitchen. tiZANidine (ZANAFLEX) 4 MG tablet Take 4 mg by mouth at bedtime as needed for muscle spasms.    . Vitamin D, Cholecalciferol, 1000 UNITS CAPS Take 2,000 mg by mouth daily.    . clonazePAM (KLONOPIN) 1 MG tablet  Take 2 mg by mouth at bedtime.      No facility-administered medications prior to visit.      Allergies:   Codeine and Gabapentin   Past Medical History:  Diagnosis Date  . Arthritis    "my back's eat up w/arthritis" (12/26/2012()  . Bulging lumbar disc    "I've got 5; they've done 3 injections then cut nerves last time" (12/26/2008)  . CAD (coronary artery disease)    a. 12/2012 Cath/PCI: LM nl, LAD nl, LCX dom, nl, OM1 99 (2.5x20 Promus DES), LPDA 20-30, RCA sm, nl, EF 55-65%.  . Chronic radicular pain of lower back    "both legs" (12/26/2012)  . CKD (chronic kidney disease), stage III (HCC)    a. s/p renal transplant 2012.  Marland Kitchen. Exertional shortness of breath    "just for the last 3 wks" (12/26/2012)  . GERD (gastroesophageal reflux disease)   . ONGEXBMW(413.2Headache(784.0)    "probably monthly" (12/26/2012)  . Hyperlipidemia   . Hypertension   . Hypothyroidism     Past Surgical History:  Procedure Laterality Date  .  CORONARY ANGIOPLASTY WITH STENT PLACEMENT  12/26/2012   "1" (12/26/2012)  . KIDNEY TRANSPLANT  2012  . LEFT HEART CATHETERIZATION WITH CORONARY ANGIOGRAM N/A 12/26/2012   Procedure: LEFT HEART CATHETERIZATION WITH CORONARY ANGIOGRAM;  Surgeon: Peter M Swaziland, MD;  Location: [facility] CATH LAB;  Service: Cardiovascular;  Laterality: N/A;  . ORIF TIBIA & FIBULA FRACTURES Right 1990's   "titanium rod and 4 screws"  . REATTACHMENT HAND Right 1979; 1986   "middle finger; thumb"  . RENAL BIOPSY, PERCUTANEOUS Right ~ 2006     Social History:  The patient  reports that he quit smoking about 39 years ago. His smoking use included cigarettes. He has a 3.00 pack-year smoking history. he has never used smokeless tobacco. He reports that he does not drink alcohol or use drugs.   Family History:  The patient's family history includes Diabetes in his father; Heart disease in his father; Hyperlipidemia in his father.    ROS:  Please see the history of present illness. All other systems are  reviewed and  Negative to the above problem except as noted.    PHYSICAL EXAM: VS:  BP 132/78   Pulse 82   Ht 5\' 10"  (1.778 m)   Wt 212 lb 12.8 oz (96.5 kg)   BMI 30.53 kg/m   GEN: Obese 57 yo, in no acute distress HEENT: normal Neck: JVP normal  No, carotid bruits, or masses Cardiac: RRR; no murmurs, rubs, or gallops,no edema  Respiratory:  clear to auscultation bilaterally, normal work of breathing GI: soft, nontender, nondistended, + BS  No hepatomegaly  MS: no deformity Moving all extremities   Skin: warm and dry, no rash Neuro:  Strength and sensation are intact Psych: euthymic mood, full affect   EKG:  EKG is not ordered today.   Lipid Panel    Component Value Date/Time   CHOL 240 (H) 11/04/2014 0817   TRIG 324 (H) 11/04/2014 0817   HDL 40 11/04/2014 0817   CHOLHDL 5 01/07/2013 1121   VLDL 34.4 01/07/2013 1121   LDLCALC 135 (H) 11/04/2014 0817      Wt Readings from Last 3 Encounters:  03/10/17 212 lb 12.8 oz (96.5 kg)  07/20/15 215 lb 3.2 oz (97.6 kg)  11/17/14 211 lb 1.9 oz (95.8 kg)      ASSESSMENT AND PLAN: 1  CAD No CP  He has never had though  Just giving out   I do not think he is having any angina\  2  HTN  Adequate control  Will get labs from renal cllnic   3  HL  On statin  Will get lipids today  4  Sleep apnea  Using CPAP    Tentative f/u in 12 months       Signed, Dietrich Pates, MD  03/10/2017 4:08 PM    [facility] [address] [place], [place], [place]  [address] Phone: [phone]; Fax: [phone]

## 2017-03-10 NOTE — Patient Instructions (Signed)
Your physician recommends that you continue on your current medications as directed. Please refer to the Current Medication list given to you today.  Your physician recommends that you return for lab work today (LIPIDS)  Your physician wants you to follow-up in: 1 year with Dr. Ross.  You will receive a reminder letter in the mail two months in advance. If you don't receive a letter, please call our office to schedule the follow-up appointment.  

## 2017-03-11 LAB — LIPID PANEL
CHOL/HDL RATIO: 4.2 ratio (ref 0.0–5.0)
Cholesterol, Total: 179 mg/dL (ref 100–199)
HDL: 43 mg/dL (ref >39)
LDL Calculated: 90 mg/dL (ref 0–99)
TRIGLYCERIDES: 232 mg/dL — AB (ref 0–149)
VLDL Cholesterol Cal: 46 mg/dL — ABNORMAL HIGH (ref 5–40)

## 2017-03-16 ENCOUNTER — Telehealth: Payer: Self-pay

## 2017-03-16 DIAGNOSIS — E782 Mixed hyperlipidemia: Secondary | ICD-10-CM

## 2017-03-16 MED ORDER — ROSUVASTATIN CALCIUM 20 MG PO TABS: 20.0000 mg | ORAL_TABLET | Freq: Every day | ORAL | 11 refills | Status: DC

## 2017-03-16 NOTE — Telephone Encounter (Signed)
-----   Message from Dietrich PatesPaula Ross V, MD sent at 03/11/2017 10:50 AM EST ----- Lipids show LDL is 90  WIth CAD would recomm increasing crestor to 20 mg   F/U lipid panel in 2 months   Goal for LDL 70 or less  Triglycerides elevated at 232  Watch carbohydrates

## 2017-03-16 NOTE — Telephone Encounter (Signed)
Informed patient of results and verbal understanding expressed.  Instructed patient to INCREASE CRESTOR to 20 mg daily. He requests prescription for fasting labs mailed to his home to get drawn at lab there in 2 months. He was grateful for call and agrees with treatment plan.

## 2017-04-04 LAB — LIPID PANEL
CHOLESTEROL TOTAL: 144 mg/dL (ref 100–199)
Chol/HDL Ratio: 3.4 ratio (ref 0.0–5.0)
HDL: 42 mg/dL (ref >39)
LDL Calculated: 75 mg/dL (ref 0–99)
TRIGLYCERIDES: 133 mg/dL (ref 0–149)
VLDL CHOLESTEROL CAL: 27 mg/dL (ref 5–40)

## 2017-04-06 ENCOUNTER — Other Ambulatory Visit: Payer: Self-pay | Admitting: *Deleted

## 2017-04-06 DIAGNOSIS — E782 Mixed hyperlipidemia: Secondary | ICD-10-CM

## 2017-05-16 ENCOUNTER — Other Ambulatory Visit: Payer: Medicare Other

## 2017-05-17 ENCOUNTER — Telehealth: Payer: Self-pay | Admitting: Internal Medicine

## 2017-05-17 DIAGNOSIS — E782 Mixed hyperlipidemia: Secondary | ICD-10-CM

## 2017-05-17 NOTE — Telephone Encounter (Signed)
Lab orders released and faxed.  I tried to return call but due to network difficulties the call would not go through.

## 2017-05-17 NOTE — Telephone Encounter (Signed)
New message    Misty from [facility] Porta [phone]  Requesting order for lipid panel be released to external or faxed to [phone]

## 2017-05-18 LAB — LIPID PANEL
CHOLESTEROL TOTAL: 154 mg/dL (ref 100–199)
Chol/HDL Ratio: 3.8 ratio (ref 0.0–5.0)
HDL: 41 mg/dL (ref >39)
LDL CALC: 80 mg/dL (ref 0–99)
Triglycerides: 165 mg/dL — ABNORMAL HIGH (ref 0–149)
VLDL Cholesterol Cal: 33 mg/dL (ref 5–40)

## 2017-05-26 ENCOUNTER — Other Ambulatory Visit: Payer: Self-pay | Admitting: *Deleted

## 2017-05-26 DIAGNOSIS — E782 Mixed hyperlipidemia: Secondary | ICD-10-CM

## 2017-05-26 MED ORDER — ROSUVASTATIN CALCIUM 40 MG PO TABS: 40.0000 mg | ORAL_TABLET | Freq: Every day | ORAL | 3 refills | Status: DC

## 2017-05-26 NOTE — Progress Notes (Signed)
Notes recorded by Lendon Ka, RN on 05/26/2017 at 4:11 PM EDT Left detailed message (DPR) on voice mail of LDL and recommendations to increase Crestor to 40 mg daily to work toward goal of LDL <70. Advised I am sending prescription to his pharmacy and that he should call back to schedule repeat lipids in 2-3 months. ------  Notes recorded by Pricilla Riffle, MD on 05/18/2017 at 10:23 PM EDT LDL still should be lower  ? He is on 20 of Crestor  I would recomm increasing to 40 mg Goal LDL less than 70 F/U lpids in 8 to 12 wks

## 2018-05-13 ENCOUNTER — Other Ambulatory Visit: Payer: Self-pay | Admitting: Internal Medicine

## 2018-10-28 IMAGING — CT CT ABD-PELV W/O CM
1 of 2 series · 15 of 32 positions shown, 19 images · non-contrast
Comparison: None.

CLINICAL DATA: Right flank pain

EXAM:
CT ABDOMEN AND PELVIS WITHOUT CONTRAST
TECHNIQUE: Multidetector CT imaging of the abdomen and pelvis was performed
following the standard protocol without IV contrast.

[Series 2: abd/pelvis w/(date) · axial · 0.73mm/px · z∈[-483,-3]mm · 15 of 106 slices shown, 19 images]
[im 5/106  soft-tissue]
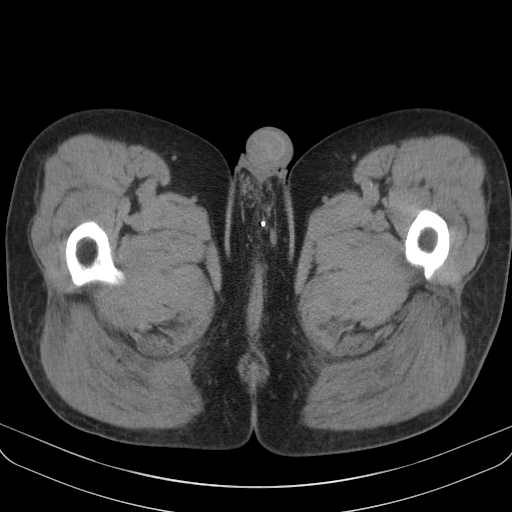
[im 5/106  bone]
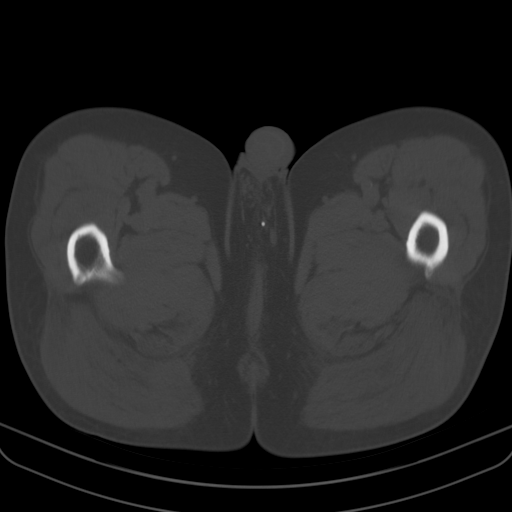
[im 13/106  soft-tissue]
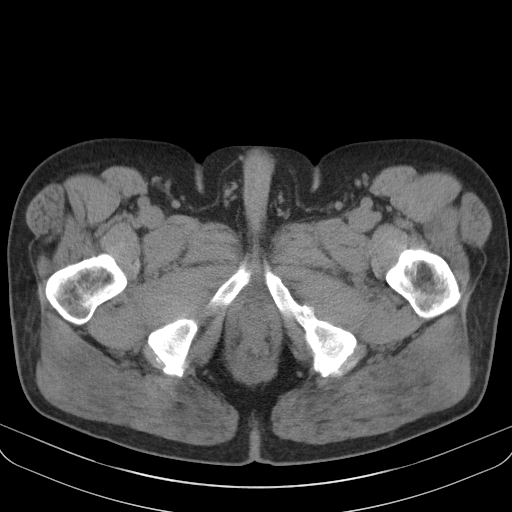
[im 21/106  soft-tissue]
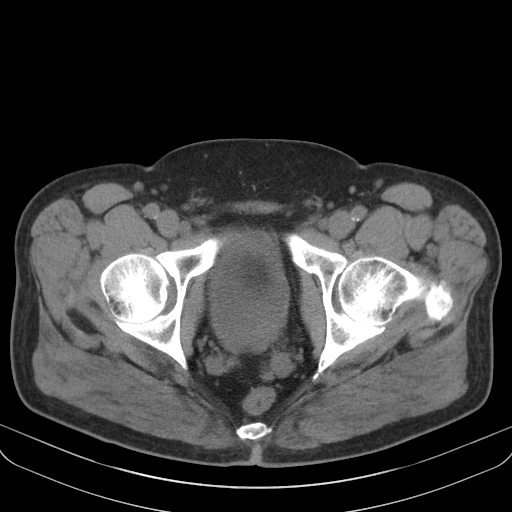
[im 29/106  soft-tissue]
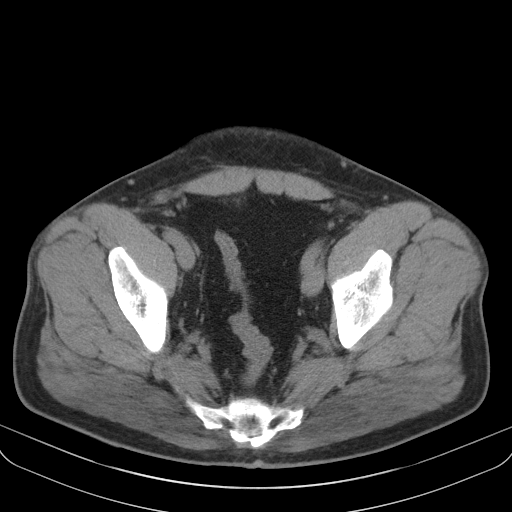
[im 37/106  soft-tissue]
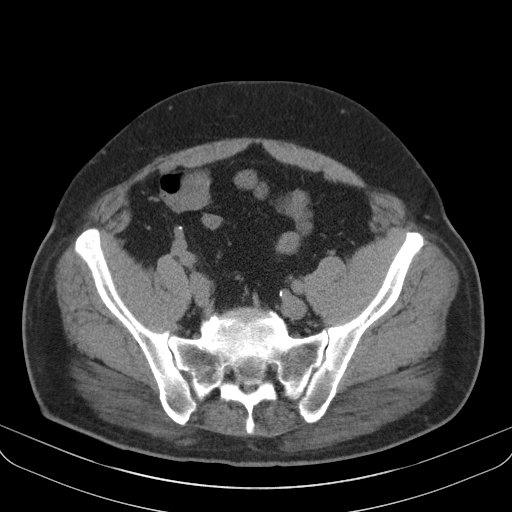
[im 45/106  soft-tissue]
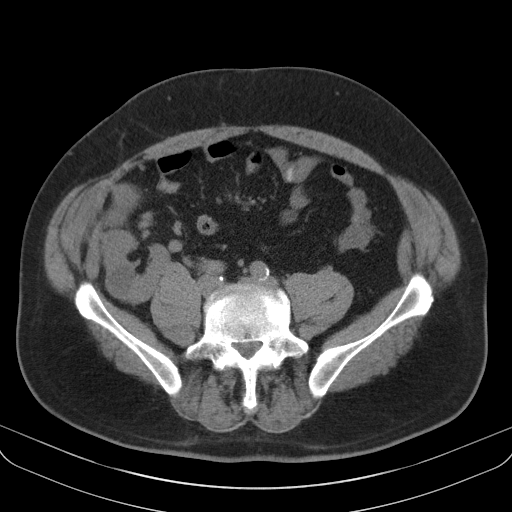
[im 53/106  soft-tissue]
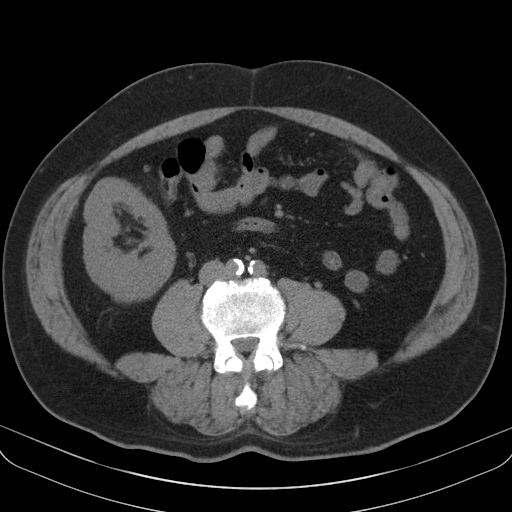
[im 61/106  soft-tissue]
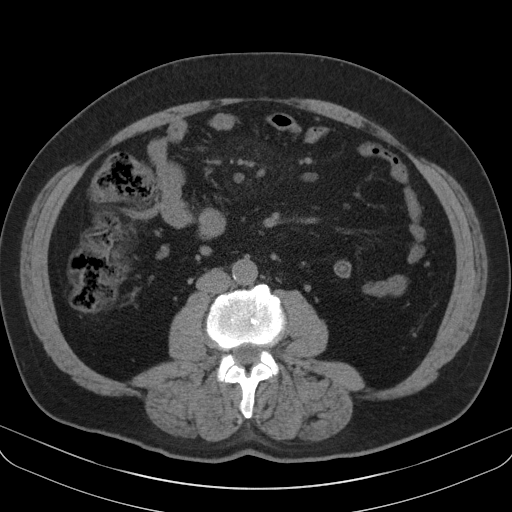
[im 69/106  soft-tissue]
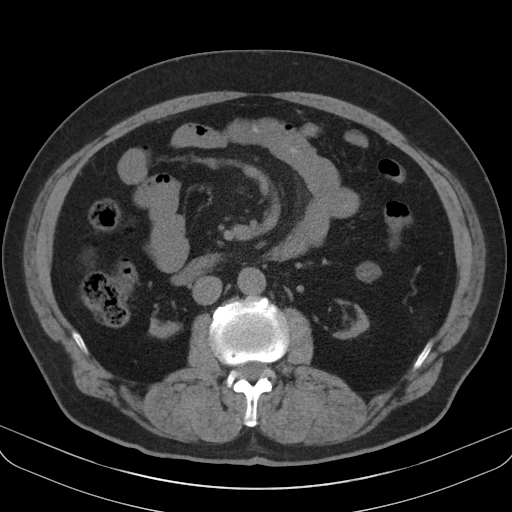
[im 69/106  bone]
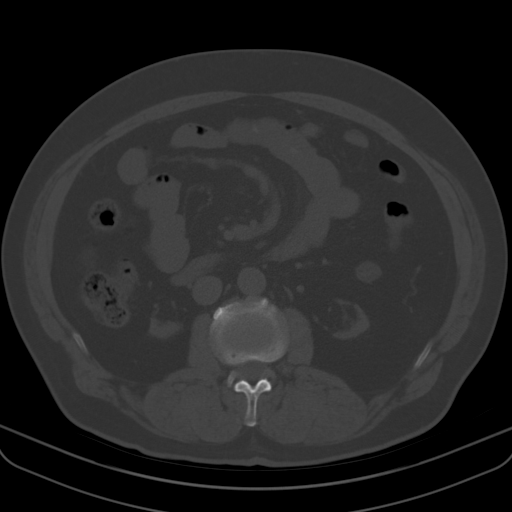
[im 77/106  soft-tissue]
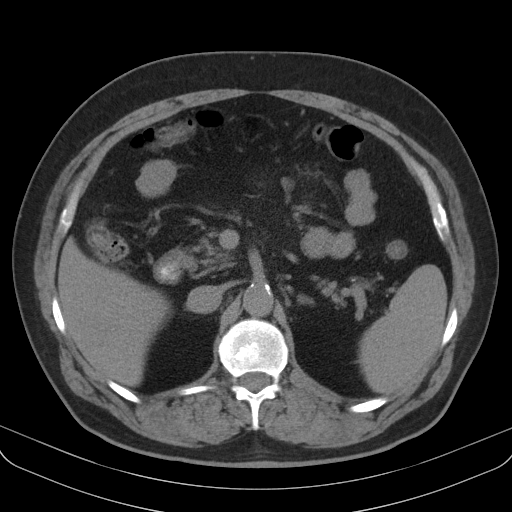
[im 85/106  soft-tissue]
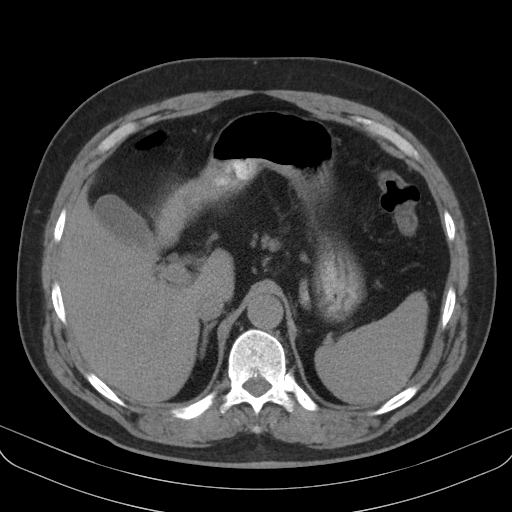
[im 89/106  lung]
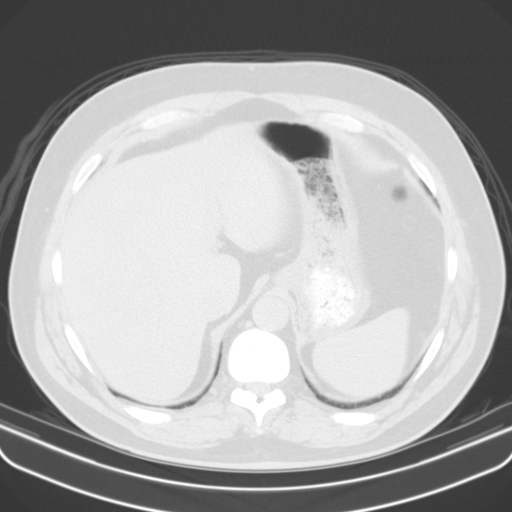
[im 93/106  soft-tissue]
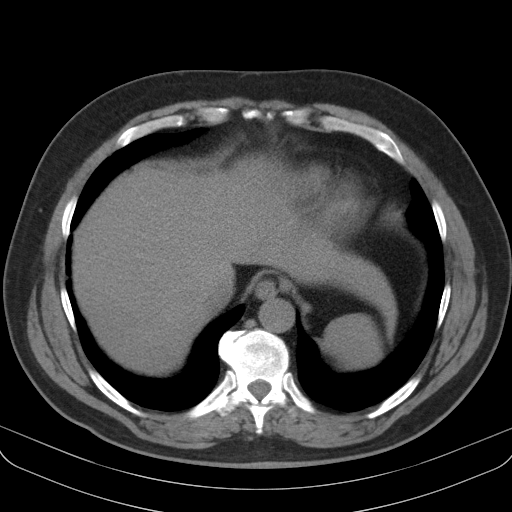
[im 93/106  lung]
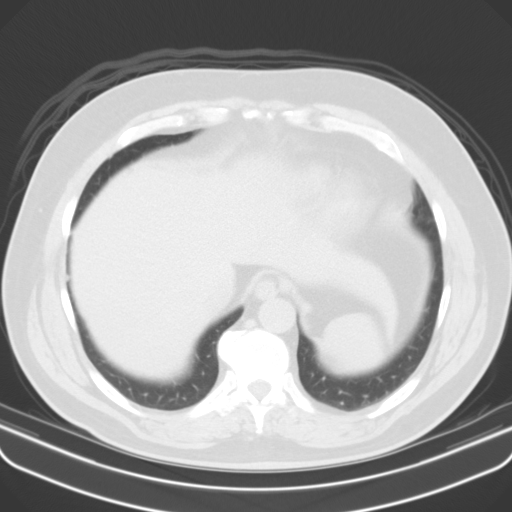
[im 97/106  lung]
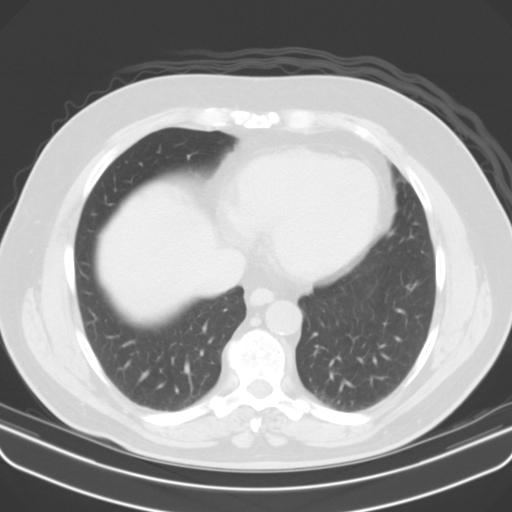
[im 101/106  soft-tissue]
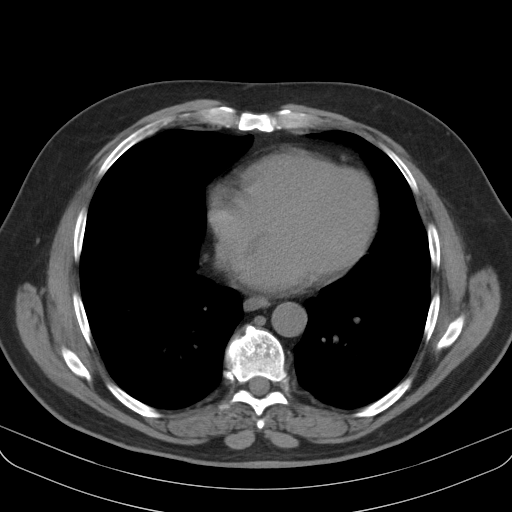
[im 101/106  lung]
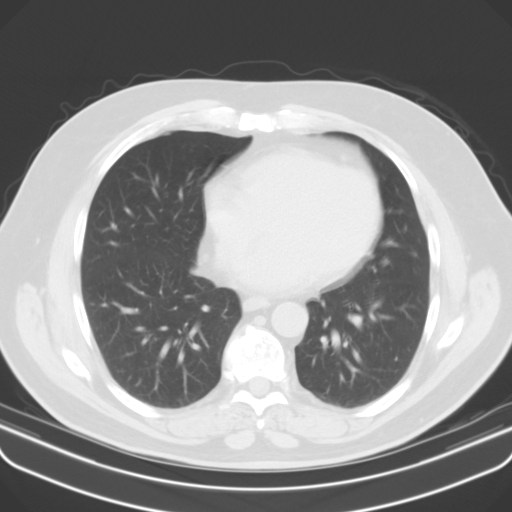

[15 of 32 positions shown; findings below may reference images not displayed]

FINDINGS: Lower chest: Lung bases are clear. No effusions. Heart is normal
size.

Hepatobiliary: No focal hepatic abnormality. Gallbladder
unremarkable.

Pancreas: No focal abnormality or ductal dilatation.

Spleen: No focal abnormality.  Normal size.

Adrenals/Urinary Tract: Severely atrophic native kidneys
bilaterally. No hydronephrosis or stones. Adrenal glands and urinary
bladder unremarkable. There is a right lower quadrant renal
transplant without hydronephrosis or stones. Low-density area in the
lower pole of the right kidney measures 2.6 cm, likely cyst.

Stomach/Bowel: Normal appendix. Stomach, large and small bowel
grossly unremarkable.

Vascular/Lymphatic: No evidence of aneurysm or adenopathy. Aortic
and iliac calcifications.

Reproductive: Mildly prominent prostate.

Other: No free fluid or free air.

Musculoskeletal: No acute bony abnormality.
IMPRESSION: Severely atrophic native kidneys with right lower quadrant
transplant kidney. No hydronephrosis or stones.

No acute findings in the abdomen or pelvis.

Aortoiliac atherosclerosis.

## 2019-06-08 ENCOUNTER — Other Ambulatory Visit: Payer: Self-pay | Admitting: Internal Medicine

## 2019-06-10 ENCOUNTER — Telehealth: Payer: Self-pay | Admitting: Internal Medicine

## 2019-06-10 MED ORDER — ROSUVASTATIN CALCIUM 40 MG PO TABS: 40.0000 mg | ORAL_TABLET | Freq: Every day | ORAL | 0 refills | Status: DC

## 2019-06-10 NOTE — Telephone Encounter (Signed)
Pt's medication was sent to pt's pharmacy as requested. Confirmation received.  °

## 2019-06-10 NOTE — Telephone Encounter (Signed)
  *  STAT* If patient is at the pharmacy, call can be transferred to refill team.   1. Which medications need to be refilled? (please list name of each medication and dose if known) rosuvastatin (CRESTOR) 40 MG tablet  2. Which pharmacy/location (including street and city if local pharmacy) is medication to be sent to? [facility] - [place], [place] - [address]  3. Do they need a 30 day or 90 day supply? 30 days  Pt scheduled appt on 07/03/19 with Dr. Tenny Craw

## 2019-07-02 NOTE — Progress Notes (Signed)
Cardiology Office Note   Date:  07/03/2019   ID:  Robert Walton, DOB 1960/02/17, MRN [id number]  PCP:  Abelina Bachelor, MD  Cardiologist:   Dietrich Pates, MD   F/7u of CAD    History of Present Illness: Robert Walton is a 59 y.o. male with a history of HTN, renal Tx. And CAD  PT had cath om 2014 'that showed 99% lesion in a large OM (L dominant system) LAD and RCA normal Patient underwent PTCA/PROMUS stent placement  I last saw the pt in clinic in 2019  Since seen the patient denies chest pain.  He has not had Covid.  He does not trust the vaccine.  His breathing is okay.  He says he is trying to watch what he eats.  Says he is active.  Admits to eating fast food.  Outpatient Medications Prior to Visit  Medication Sig Dispense Refill  . amLODipine (NORVASC) 10 MG tablet Take 10 mg by mouth daily.    Marland Kitchen aspirin EC 81 MG tablet Take 81 mg by mouth daily.    . clobetasol (TEMOVATE) 0.05 % external solution     . cyclobenzaprine (FLEXERIL) 10 MG tablet Take 10 mg by mouth 3 (three) times daily as needed (for pain).    . furosemide (LASIX) 20 MG tablet Take 20 mg by mouth daily.     Marland Kitchen levothyroxine (SYNTHROID, LEVOTHROID) 50 MCG tablet Take 50 mcg by mouth daily before breakfast.    . magnesium oxide (MAG-OX) 400 MG tablet Take 1,200 mg by mouth 2 (two) times daily. Take 2 hours before taking prograf and myfortic    . mycophenolate (MYFORTIC) 180 MG EC tablet Take 720 mg by mouth 2 (two) times daily.     . nitroGLYCERIN (NITROSTAT) 0.4 MG SL tablet Place 0.4 mg under the tongue every 5 (five) minutes as needed for chest pain.    Marland Kitchen omeprazole (PRILOSEC) 20 MG capsule Take 20 mg by mouth daily.    . Oxycodone HCl 10 MG TABS Take 10 mg by mouth 4 (four) times daily as needed. (PAIN)  0  . rosuvastatin (CRESTOR) 40 MG tablet Take 1 tablet (40 mg total) by mouth daily. Please keep upcoming appt with Dr. Tenny Craw in June before anymore refills. Thank you 30 tablet 0  . tacrolimus (PROGRAF) 1 MG  capsule Take 3 mg by mouth 2 (two) times daily.     Marland Kitchen tiZANidine (ZANAFLEX) 4 MG tablet Take 4 mg by mouth at bedtime as needed for muscle spasms.    . Vitamin D, Cholecalciferol, 1000 UNITS CAPS Take 2,000 mg by mouth daily.     No facility-administered medications prior to visit.     Allergies:   Codeine and Gabapentin   Past Medical History:  Diagnosis Date  . Arthritis    "my back's eat up w/arthritis" (12/26/2012()  . Bulging lumbar disc    "I've got 5; they've done 3 injections then cut nerves last time" (12/26/2008)  . CAD (coronary artery disease)    a. 12/2012 Cath/PCI: LM nl, LAD nl, LCX dom, nl, OM1 99 (2.5x20 Promus DES), LPDA 20-30, RCA sm, nl, EF 55-65%.  . Chronic radicular pain of lower back    "both legs" (12/26/2012)  . CKD (chronic kidney disease), stage III    a. s/p renal transplant 2012.  Marland Kitchen Exertional shortness of breath    "just for the last 3 wks" (12/26/2012)  . GERD (gastroesophageal reflux disease)   . Headache(784.0)    "  probably monthly" (12/26/2012)  . Hyperlipidemia   . Hypertension   . Hypothyroidism     Past Surgical History:  Procedure Laterality Date  . CORONARY ANGIOPLASTY WITH STENT PLACEMENT  12/26/2012   "1" (12/26/2012)  . KIDNEY TRANSPLANT  2012  . LEFT HEART CATHETERIZATION WITH CORONARY ANGIOGRAM N/A 12/26/2012   Procedure: LEFT HEART CATHETERIZATION WITH CORONARY ANGIOGRAM;  Surgeon: Peter M Martinique, MD;  Location: [facility] CATH LAB;  Service: Cardiovascular;  Laterality: N/A;  . ORIF TIBIA & FIBULA FRACTURES Right 1990's   "titanium rod and 4 screws"  . REATTACHMENT HAND Right 1979; 1986   "middle finger; thumb"  . RENAL BIOPSY, PERCUTANEOUS Right ~ 2006     Social History:  The patient  reports that he quit smoking about 41 years ago. His smoking use included cigarettes. He has a 3.00 pack-year smoking history. He has never used smokeless tobacco. He reports that he does not drink alcohol and does not use drugs.   Family History:   The patient's family history includes Diabetes in his father; Heart disease in his father; Hyperlipidemia in his father.    ROS:  Please see the history of present illness. All other systems are reviewed and  Negative to the above problem except as noted.    PHYSICAL EXAM: VS:  BP 134/86   Pulse 76   Ht 5\' 10"  (1.778 m)   Wt 217 lb 9.6 oz (98.7 kg)   BMI 31.22 kg/m   GEN: Obese 59 yo, in no acute distress HEENT: normal Neck: JVP normal  No, carotid bruits Cardiac: RRR; no murmurs, rubs, or gallops,no edema  Respiratory:  clear to auscultation bilaterally, normal work of breathing GI: soft, nontender, nondistended, + BS  No hepatomegaly  MS: no deformity Moving all extremities   Skin: warm and dry, no rash Neuro:  Strength and sensation are intact Psych: euthymic mood, full affect   EKG:  EKG is  ordered today.  SR 76 bpm     Lipid Panel    Component Value Date/Time   CHOL 154 05/17/2017 1000   TRIG 165 (H) 05/17/2017 1000   HDL 41 05/17/2017 1000   CHOLHDL 3.8 05/17/2017 1000   CHOLHDL 5 01/07/2013 1121   VLDL 34.4 01/07/2013 1121   LDLCALC 80 05/17/2017 1000      Wt Readings from Last 3 Encounters:  07/03/19 217 lb 9.6 oz (98.7 kg)  03/10/17 212 lb 12.8 oz (96.5 kg)  07/20/15 215 lb 3.2 oz (97.6 kg)      ASSESSMENT AND PLAN: 1  CAD No CP intervention in past.  No symptoms to suggest angina.  Follow 2  HTN blood pressure controlled.  We will check labs today.  No change in medication.  3  HL  On statin  Will get lipids today  4  Sleep apnea says he is using CPAP   5.  Covid counseled on Covid vaccine.  Again he does not trust it.  We will check vitamin D.  Informed him about monoclonal antibody should he get sick.  Also encouraged him if he gets sick to get a pulse oximeter.    Tentative f/u next spring     Signed, Lachlyn Vanderstelt, MD  07/03/2019 10:10 AM    [address], [place], [place]  [address] Phone: [phone]; Fax: [phone]

## 2019-07-03 ENCOUNTER — Ambulatory Visit (INDEPENDENT_AMBULATORY_CARE_PROVIDER_SITE_OTHER): Payer: Medicare HMO | Admitting: Internal Medicine

## 2019-07-03 ENCOUNTER — Other Ambulatory Visit: Payer: Self-pay

## 2019-07-03 ENCOUNTER — Encounter: Payer: Self-pay | Admitting: Internal Medicine

## 2019-07-03 VITALS — BP 134/86 | HR 76 | Ht 70.0 in | Wt 217.6 lb

## 2019-07-03 DIAGNOSIS — E039 Hypothyroidism, unspecified: Secondary | ICD-10-CM

## 2019-07-03 DIAGNOSIS — E559 Vitamin D deficiency, unspecified: Secondary | ICD-10-CM

## 2019-07-03 DIAGNOSIS — I2510 Atherosclerotic heart disease of native coronary artery without angina pectoris: Secondary | ICD-10-CM | POA: Diagnosis not present

## 2019-07-03 DIAGNOSIS — E782 Mixed hyperlipidemia: Secondary | ICD-10-CM

## 2019-07-03 NOTE — Patient Instructions (Signed)
Medication Instructions:  No changes *If you need a refill on your cardiac medications before your next appointment, please call your pharmacy*   Lab Work: Today: cbc, bmet, tsh, lipids, vit d If you have labs (blood work) drawn today and your tests are completely normal, you will receive your results only by: Marland Kitchen MyChart Message (if you have MyChart) OR . A paper copy in the mail If you have any lab test that is abnormal or we need to change your treatment, we will call you to review the results.   Testing/Procedures: none   Follow-Up: At [facility], you and your health needs are our priority.  As part of our continuing mission to provide you with exceptional heart care, we have created designated Provider Care Teams.  These Care Teams include your primary Cardiologist (physician) and Advanced Practice Providers (APPs -  Physician Assistants and Nurse Practitioners) who all work together to provide you with the care you need, when you need it.  We recommend signing up for the patient portal called "MyChart".  Sign up information is provided on this After Visit Summary.  MyChart is used to connect with patients for Virtual Visits (Telemedicine).  Patients are able to view lab/test results, encounter notes, upcoming appointments, etc.  Non-urgent messages can be sent to your provider as well.   To learn more about what you can do with MyChart, go to [email or web address].    Your next appointment:   9 month(s)  The format for your next appointment:   In Person  Provider:   You may see Dietrich Pates, MD or one of the following Advanced Practice Providers on your designated Care Team:    Tereso Newcomer, PA-C  Chelsea Aus, [place]    Other Instructions

## 2019-07-04 LAB — LIPID PANEL
Chol/HDL Ratio: 2.8 ratio (ref 0.0–5.0)
Cholesterol, Total: 135 mg/dL (ref 100–199)
HDL: 48 mg/dL (ref >39)
LDL Chol Calc (NIH): 62 mg/dL (ref 0–99)
Triglycerides: 148 mg/dL (ref 0–149)
VLDL Cholesterol Cal: 25 mg/dL (ref 5–40)

## 2019-07-04 LAB — CBC
Hematocrit: 47.5 % (ref 37.5–51.0)
Hemoglobin: 15.3 g/dL (ref 13.0–17.7)
MCH: 27.2 pg (ref 26.6–33.0)
MCHC: 32.2 g/dL (ref 31.5–35.7)
MCV: 84 fL (ref 79–97)
Platelets: 236 10*3/uL (ref 150–450)
RBC: 5.63 x10E6/uL (ref 4.14–5.80)
RDW: 14 % (ref 11.6–15.4)
WBC: 6 10*3/uL (ref 3.4–10.8)

## 2019-07-04 LAB — BASIC METABOLIC PANEL
BUN/Creatinine Ratio: 9 — ABNORMAL LOW (ref 10–24)
BUN: 14 mg/dL (ref 8–27)
CO2: 25 mmol/L (ref 20–29)
Calcium: 9 mg/dL (ref 8.6–10.2)
Chloride: 99 mmol/L (ref 96–106)
Creatinine, Ser: 1.48 mg/dL — ABNORMAL HIGH (ref 0.76–1.27)
GFR calc Af Amer: 59 mL/min/{1.73_m2} — ABNORMAL LOW (ref >59)
GFR calc non Af Amer: 51 mL/min/{1.73_m2} — ABNORMAL LOW (ref >59)
Glucose: 112 mg/dL — ABNORMAL HIGH (ref 65–99)
Potassium: 3.9 mmol/L (ref 3.5–5.2)
Sodium: 139 mmol/L (ref 134–144)

## 2019-07-04 LAB — TSH: TSH: 2.29 u[IU]/mL (ref 0.450–4.500)

## 2019-07-04 LAB — VITAMIN D 25 HYDROXY (VIT D DEFICIENCY, FRACTURES): Vit D, 25-Hydroxy: 28.2 ng/mL — ABNORMAL LOW (ref 30.0–100.0)

## 2019-07-12 ENCOUNTER — Telehealth: Payer: Self-pay | Admitting: Internal Medicine

## 2019-07-12 MED ORDER — ROSUVASTATIN CALCIUM 40 MG PO TABS: 40.0000 mg | ORAL_TABLET | Freq: Every day | ORAL | 3 refills | Status: DC

## 2019-07-12 NOTE — Telephone Encounter (Signed)
*  STAT* If patient is at the pharmacy, call can be transferred to refill team.   1. Which medications need to be refilled? (please list name of each medication and dose if known)  rosuvastatin (CRESTOR) 40 MG tablet  2. Which pharmacy/location (including street and city if local pharmacy) is medication to be sent to?   [facility] - [place], [place] - [address]  3. Do they need a 30 day or 90 day supply? 90

## 2019-07-12 NOTE — Telephone Encounter (Signed)
Follow up  Pt is calling back he said he needs his prescription today since he is leaving on Monday and will be gone for a week

## 2019-07-12 NOTE — Telephone Encounter (Signed)
Pt's medication was sent to pt's pharmacy as requested. Confirmation received.  °

## 2020-06-16 ENCOUNTER — Telehealth: Payer: Self-pay | Admitting: Internal Medicine

## 2020-06-16 NOTE — Telephone Encounter (Signed)
    Pt said last Sunday his arm felt numb that last for 5 mins. He said he never felt it again, didn't take anything and no other symptoms. He said he was told by pcp to call Dr. Tenny Craw if he needs an appt, tried to schedule since he his due for recall but pt only wanted to see Dr. Tenny Craw.

## 2020-06-17 NOTE — Telephone Encounter (Signed)
Saturday hanging up a rack in his barn above his head when hand all the way to shoulder went numb.  Stopped what he was doing it lasted 5 min then resolved completely.   Has carpal tunnel in both hands and they have been numb before but this was different.  States he has been weak every since and yesterday he was dizzy all day.  Today sinuses are bothering him otherwise ok.  No other neuro symptoms occurred.  No chest pain or SOB.  He saw his pain doctor yesterday and told him.  "pain doctor said I could be fixin to have a stroke".    Pt is scheduled for follow up with L. Ingold 09/03/20.  Pt aware I will forward to Dr. Tenny Craw for any recommendations in the meantime and then we will call him back.

## 2020-06-17 NOTE — Telephone Encounter (Signed)
Pt is returning call from 06/16/20 in regards to his left arm pain, Pt only wants to see Dr. Tenny Craw, however I was able to make pt an appointment from his recall for 33month f/u. Pt would like a callback so he will know how to move forward with the left arm pain. Please advise

## 2020-06-19 NOTE — Telephone Encounter (Signed)
Patient says lifting something in barn, arm got numb tingly when above head   Resolved when back down I am not convinced this is a neurologic event or warning sign for one     Recomm he follow   Call if develops new symtpoms that are concerning

## 2020-06-28 ENCOUNTER — Other Ambulatory Visit: Payer: Self-pay | Admitting: Internal Medicine

## 2020-09-02 NOTE — Progress Notes (Signed)
Cardiology Office Note   Date:  09/03/2020   ID:  Robert Walton, DOB Jan 11, 1961, MRN [id number]  PCP:  Robert Bachelor, MD  Cardiologist:  Dr. Tenny Craw     Chief Complaint  Patient presents with   Coronary Artery Disease      History of Present Illness: Robert Walton is a 60 y.o. male who presents for CAD.  He has a history of HTN, renal Tx. And CAD  PT had cath om 2014 'that showed 99% lesion in a large OM (L dominant system)  LAD and RCA normal Patient underwent PTCA/PROMUS stent placement  No COVID   Stable on visit 07/03/19   Mg+ 1.4 per renal and increase of Mg+ oxide  Sleep apnea.  LDL 69 HDL 46 TG 123  lipids  A1C 5.9  Cr 1.47 labs 02/2020  Stable no chest pain no SOB.  Feels well has active life.  Tries to eat healthy, now bakes his food or uses a smoker.  Has cut back on fried foods.    Past Medical History:  Diagnosis Date   Arthritis    "my back's eat up w/arthritis" (12/26/2012()   Bulging lumbar disc    "I've got 5; they've done 3 injections then cut nerves last time" (12/26/2008)   CAD (coronary artery disease)    a. 12/2012 Cath/PCI: LM nl, LAD nl, LCX dom, nl, OM1 99 (2.5x20 Promus DES), LPDA 20-30, RCA sm, nl, EF 55-65%.   Chronic radicular pain of lower back    "both legs" (12/26/2012)   CKD (chronic kidney disease), stage III (HCC)    a. s/p renal transplant 2012.   Exertional shortness of breath    "just for the last 3 wks" (12/26/2012)   GERD (gastroesophageal reflux disease)    Headache(784.0)    "probably monthly" (12/26/2012)   Hyperlipidemia    Hypertension    Hypothyroidism     Past Surgical History:  Procedure Laterality Date   CORONARY ANGIOPLASTY WITH STENT PLACEMENT  12/26/2012   "1" (12/26/2012)   KIDNEY TRANSPLANT  2012   LEFT HEART CATHETERIZATION WITH CORONARY ANGIOGRAM N/A 12/26/2012   Procedure: LEFT HEART CATHETERIZATION WITH CORONARY ANGIOGRAM;  Surgeon: Peter M Swaziland, MD;  Location: [facility] CATH LAB;  Service: Cardiovascular;   Laterality: N/A;   ORIF TIBIA & FIBULA FRACTURES Right 1990's   "titanium rod and 4 screws"   REATTACHMENT HAND Right 1979; 1986   "middle finger; thumb"   RENAL BIOPSY, PERCUTANEOUS Right ~ 2006     Current Outpatient Medications  Medication Sig Dispense Refill   amLODipine (NORVASC) 10 MG tablet Take 10 mg by mouth daily.     aspirin EC 81 MG tablet Take 81 mg by mouth daily.     clobetasol (TEMOVATE) 0.05 % external solution      cyclobenzaprine (FLEXERIL) 10 MG tablet Take 10 mg by mouth 3 (three) times daily as needed (for pain).     furosemide (LASIX) 20 MG tablet Take 20 mg by mouth daily.      levothyroxine (SYNTHROID) 50 MCG tablet Take by mouth.     levothyroxine (SYNTHROID, LEVOTHROID) 50 MCG tablet Take 50 mcg by mouth daily before breakfast.     magnesium oxide (MAG-OX) 400 MG tablet Take 1,200 mg by mouth 2 (two) times daily. Take 2 hours before taking prograf and myfortic     methocarbamol (ROBAXIN) 500 MG tablet take 1 (one) Tablet at bedtime if needed for muscle cramps, max daily dose 1 Tablet  mycophenolate (MYFORTIC) 180 MG EC tablet Take 720 mg by mouth 2 (two) times daily.      omeprazole (PRILOSEC) 20 MG capsule Take 20 mg by mouth daily.     Oxycodone HCl 10 MG TABS Take 10 mg by mouth 4 (four) times daily as needed.  0   rosuvastatin (CRESTOR) 40 MG tablet Take 1 tablet (40 mg total) by mouth daily. 90 tablet 0   tacrolimus (PROGRAF) 0.5 MG capsule 1 (one) Capsule by mouth daily     tacrolimus (PROGRAF) 1 MG capsule Take 3 mg by mouth 2 (two) times daily.      tiZANidine (ZANAFLEX) 4 MG tablet Take 4 mg by mouth at bedtime as needed for muscle spasms.     Vitamin D, Cholecalciferol, 1000 UNITS CAPS Take 2,000 mg by mouth daily.     nitroGLYCERIN (NITROSTAT) 0.4 MG SL tablet Place 1 tablet (0.4 mg total) under the tongue every 5 (five) minutes as needed for chest pain. 25 tablet 3   No current facility-administered medications for this visit.    Allergies:    Codeine and Gabapentin    Social History:  The patient  reports that he quit smoking about 42 years ago. His smoking use included cigarettes. He has a 3.00 pack-year smoking history. He has never used smokeless tobacco. He reports that he does not drink alcohol and does not use drugs.   Family History:  The patient's family history includes Diabetes in his father; Heart disease in his father; Hyperlipidemia in his father.    ROS:  General:no colds or fevers, no weight changes Skin:no rashes or ulcers HEENT:no blurred vision, no congestion CV:see HPI PUL:see HPI GI:no diarrhea constipation or melena, no indigestion GU:no hematuria, no dysuria MS:no joint pain, no claudication Neuro:no syncope, no lightheadedness Endo:no diabetes, + thyroid disease   Wt Readings from Last 3 Encounters:  09/03/20 203 lb 6.4 oz (92.3 kg)  07/03/19 217 lb 9.6 oz (98.7 kg)  03/10/17 212 lb 12.8 oz (96.5 kg)     PHYSICAL EXAM: VS:  BP 118/68   Pulse 71   Ht 5\' 10"  (1.778 m)   Wt 203 lb 6.4 oz (92.3 kg)   SpO2 98%   BMI 29.18 kg/m  , BMI Body mass index is 29.18 kg/m. General:Pleasant affect, NAD Skin:Warm and dry, brisk capillary refill HEENT:normocephalic, sclera clear, mucus membranes moist Neck:supple, no JVD, no bruits  Heart:S1S2 RRR without murmur, gallup, rub or click Lungs:clear without rales, rhonchi, or wheezes , non tender, + BS, do not palpate liver spleen or masses Ext:no lower ext edema, 2+ pedal pulses, 2+ radial pulses Neuro:alert and oriented, MAE, follows commands, + facial symmetry    EKG:  EKG is ordered today. The ekg ordered today demonstrates    Recent Labs: No results found for requested labs within last 8760 hours.    Lipid Panel    Component Value Date/Time   CHOL 135 07/03/2019 1028   TRIG 148 07/03/2019 1028   HDL 48 07/03/2019 1028   CHOLHDL 2.8 07/03/2019 1028   CHOLHDL 5 01/07/2013 1121   VLDL 34.4 01/07/2013 1121   LDLCALC 62 07/03/2019  1028       Other studies Reviewed: Additional studies/ records that were reviewed today include: .  2015CHO Study Conclusions   - Left ventricle: The cavity size was mildly dilated. Wall    thickness was normal. Systolic function was normal. The estimated    ejection fraction was in the range of 55% to  60%.  - Aortic valve: There was mild regurgitation.  - Mitral valve: There was mild regurgitation.    Cath 2014 PCI Data: Vessel - OM1/Segment - proximal Percent Stenosis (pre)  99% TIMI-flow 1 Stent 2.5 x 20 mm Promus Percent Stenosis (post) 0% TIMI-flow (post) 3   Final Conclusions:   1. Single vessel obstructive CAD  2. Normal LV function.    Recommendations:  Continue dual antiplatelet therapy for one year.   ASSESSMENT AND PLAN:  1.  CAD with hx of PCI in 2014 with STENT to 1st OM, no angina EKG stable no changes recent statins from renal office at goal.  Continue statin and ASA - continue healthy diet.   2.  HTN controlled continue amlodipine  3.  HLD on statin and at goal with LDL<70    4.  Hx of renal transplant followed by [facility] stable.    Current medicines are reviewed with the patient today.  The patient Has no concerns regarding medicines.  The following changes have been made:  See above Labs/ tests ordered today include:see above  Disposition:   FU:  see above  Signed, Nada Boozer, NP  09/03/2020 9:12 AM    [facility] [address] [place], [place], [place]  [address]/ [address] [place], [place] Phone: [phone]; Fax: [phone]  [phone]

## 2020-09-03 ENCOUNTER — Ambulatory Visit: Payer: Medicare HMO | Admitting: Cardiology

## 2020-09-03 ENCOUNTER — Encounter: Payer: Self-pay | Admitting: Cardiology

## 2020-09-03 ENCOUNTER — Other Ambulatory Visit: Payer: Self-pay

## 2020-09-03 VITALS — BP 118/68 | HR 71 | Ht 70.0 in | Wt 203.4 lb

## 2020-09-03 DIAGNOSIS — I2510 Atherosclerotic heart disease of native coronary artery without angina pectoris: Secondary | ICD-10-CM

## 2020-09-03 DIAGNOSIS — E782 Mixed hyperlipidemia: Secondary | ICD-10-CM | POA: Diagnosis not present

## 2020-09-03 DIAGNOSIS — Z940 Kidney transplant status: Secondary | ICD-10-CM

## 2020-09-03 DIAGNOSIS — I10 Essential (primary) hypertension: Secondary | ICD-10-CM | POA: Diagnosis not present

## 2020-09-03 DIAGNOSIS — E039 Hypothyroidism, unspecified: Secondary | ICD-10-CM

## 2020-09-03 MED ORDER — NITROGLYCERIN 0.4 MG SL SUBL: 0.4000 mg | SUBLINGUAL_TABLET | SUBLINGUAL | 3 refills | Status: DC | PRN

## 2020-09-03 NOTE — Patient Instructions (Signed)
Medication Instructions:  Your physician recommends that you continue on your current medications as directed. Please refer to the Current Medication list given to you today.  *If you need a refill on your cardiac medications before your next appointment, please call your pharmacy*   Lab Work: None ordered  If you have labs (blood work) drawn today and your tests are completely normal, you will receive your results only by: . MyChart Message (if you have MyChart) OR . A paper copy in the mail If you have any lab test that is abnormal or we need to change your treatment, we will call you to review the results.   Testing/Procedures: None ordered   Follow-Up: At [facility], you and your health needs are our priority.  As part of our continuing mission to provide you with exceptional heart care, we have created designated Provider Care Teams.  These Care Teams include your primary Cardiologist (physician) and Advanced Practice Providers (APPs -  Physician Assistants and Nurse Practitioners) who all work together to provide you with the care you need, when you need it.  We recommend signing up for the patient portal called "MyChart".  Sign up information is provided on this After Visit Summary.  MyChart is used to connect with patients for Virtual Visits (Telemedicine).  Patients are able to view lab/test results, encounter notes, upcoming appointments, etc.  Non-urgent messages can be sent to your provider as well.   To learn more about what you can do with MyChart, go to [email or web address].    Your next appointment:   12 month(s)  The format for your next appointment:   In Person  Provider:   You may see Paula Ross, MD or one of the following Advanced Practice Providers on your designated Care Team:    Scott Weaver, PA-C  Vin Bhagat, PA-C    Other Instructions   

## 2020-10-12 ENCOUNTER — Other Ambulatory Visit: Payer: Self-pay

## 2020-10-12 MED ORDER — ROSUVASTATIN CALCIUM 40 MG PO TABS: 40.0000 mg | ORAL_TABLET | Freq: Every day | ORAL | 3 refills | Status: DC

## 2020-10-12 MED ORDER — LEVOTHYROXINE SODIUM 50 MCG PO TABS: 50.0000 ug | ORAL_TABLET | Freq: Every day | ORAL | 3 refills | Status: DC

## 2021-09-20 ENCOUNTER — Other Ambulatory Visit: Payer: Self-pay | Admitting: Internal Medicine

## 2021-09-22 NOTE — Telephone Encounter (Signed)
Pt is requesting a refill on levothyroxine. Would Dr. Tenny Craw like to refill this medication? Please address

## 2021-11-15 ENCOUNTER — Telehealth: Payer: Self-pay | Admitting: Internal Medicine

## 2021-11-15 ENCOUNTER — Other Ambulatory Visit: Payer: Self-pay

## 2021-11-15 MED ORDER — LEVOTHYROXINE SODIUM 50 MCG PO TABS: 50.0000 ug | ORAL_TABLET | Freq: Every day | ORAL | 0 refills | Status: AC

## 2021-11-15 NOTE — Telephone Encounter (Signed)
See other open encounter re: this med request.

## 2021-11-15 NOTE — Telephone Encounter (Signed)
Left a detailed message for the pt per his DPR that if his PCP has been checking his Thyroid labs that he should notify them that he needs a refill on his Synthroid but if not he should keep his appt with Dr Harrington Challenger 01/04/22 so we cn safely be sure that he has the right doing... to let us know if we need to send him in a short supply until he is seen.

## 2021-11-15 NOTE — Telephone Encounter (Signed)
*  STAT* If patient is at the pharmacy, call can be transferred to refill team.   1. Which medications need to be refilled? (please list name of each medication and dose if known) levothyroxine (SYNTHROID) 50 MCG tablet  2. Which pharmacy/location (including street and city if local pharmacy) is medication to be sent to?  [place]  3. Do they need a 30 day or 90 day supply? 90  Pt made an appt for 01/04/22

## 2021-11-15 NOTE — Telephone Encounter (Signed)
Pt calling requesting a refill on levothyroxine. Would Dr. Harrington Challenger like to refill this medication? Thanks

## 2021-11-15 NOTE — Telephone Encounter (Signed)
Pt calling requesting a refill on levothyroxine. Would Dr. Ross like to refill this medication? Please address 

## 2021-12-01 ENCOUNTER — Other Ambulatory Visit: Payer: Self-pay | Admitting: Nephrology

## 2021-12-01 DIAGNOSIS — I10 Essential (primary) hypertension: Secondary | ICD-10-CM

## 2021-12-01 DIAGNOSIS — Z940 Kidney transplant status: Secondary | ICD-10-CM

## 2021-12-01 DIAGNOSIS — D631 Anemia in chronic kidney disease: Secondary | ICD-10-CM

## 2021-12-01 DIAGNOSIS — E212 Other hyperparathyroidism: Secondary | ICD-10-CM

## 2021-12-15 ENCOUNTER — Ambulatory Visit
Admission: RE | Admit: 2021-12-15 | Discharge: 2021-12-15 | Disposition: A | Discharge status: Home / Self Care | Payer: Medicare HMO | Source: Ambulatory Visit | Attending: Nephrology | Admitting: Nephrology

## 2021-12-15 ENCOUNTER — Other Ambulatory Visit: Payer: Medicare HMO

## 2021-12-15 DIAGNOSIS — I10 Essential (primary) hypertension: Secondary | ICD-10-CM

## 2021-12-15 DIAGNOSIS — E212 Other hyperparathyroidism: Secondary | ICD-10-CM

## 2021-12-15 DIAGNOSIS — N189 Chronic kidney disease, unspecified: Secondary | ICD-10-CM

## 2021-12-15 DIAGNOSIS — Z940 Kidney transplant status: Secondary | ICD-10-CM

## 2021-12-24 ENCOUNTER — Other Ambulatory Visit: Payer: Self-pay | Admitting: Internal Medicine

## 2022-01-01 NOTE — Progress Notes (Signed)
Cardiology Office Note   Date:  01/04/2022   ID:  Robert Walton, DOB 1960-03-23, MRN [id number]  PCP:  Abelina Bachelor, MD  Cardiologist:   Dietrich Pates, MD   F/7u of CAD    History of Present Illness: Robert Walton is a 61 y.o. male with a history of HTN, renal Tx. And CAD  PT had cath om 2014 'that showed 99% lesion in a large OM (L dominant system)  LAD and RCA normal Patient underwent PTCA/PROMUS stent placement  Since he was in clinic last he denies CP  Breathing is OK  NO dizziness   Does some walking     Outpatient Medications Prior to Visit  Medication Sig Dispense Refill   amLODipine (NORVASC) 10 MG tablet Take 10 mg by mouth daily.     aspirin EC 81 MG tablet Take 81 mg by mouth daily.     clobetasol (TEMOVATE) 0.05 % external solution      cyclobenzaprine (FLEXERIL) 10 MG tablet Take 10 mg by mouth 3 (three) times daily as needed (for pain).     furosemide (LASIX) 20 MG tablet Take 20 mg by mouth daily.      levothyroxine (SYNTHROID) 50 MCG tablet Take 1 tablet (50 mcg total) by mouth daily before breakfast. KEEP APPT IN DEC FOR ANY FURTHER REFILLS OR CALL YOUR PCP. 60 tablet 0   magnesium oxide (MAG-OX) 400 MG tablet Take 1,200 mg by mouth 2 (two) times daily. Take 2 hours before taking prograf and myfortic     methocarbamol (ROBAXIN) 500 MG tablet take 1 (one) Tablet at bedtime if needed for muscle cramps, max daily dose 1 Tablet     mycophenolate (MYFORTIC) 180 MG EC tablet Take 720 mg by mouth 2 (two) times daily.      nitroGLYCERIN (NITROSTAT) 0.4 MG SL tablet Place 1 tablet (0.4 mg total) under the tongue every 5 (five) minutes as needed for chest pain. 25 tablet 3   omeprazole (PRILOSEC) 20 MG capsule Take 20 mg by mouth daily.     Oxycodone HCl 10 MG TABS Take 10 mg by mouth 4 (four) times daily as needed.  0   rosuvastatin (CRESTOR) 40 MG tablet Take 1 tablet (40 mg total) by mouth daily. 30 tablet 0   tacrolimus (PROGRAF) 0.5 MG capsule 1 (one) Capsule by  mouth daily     tacrolimus (PROGRAF) 1 MG capsule Take 3 mg by mouth 2 (two) times daily.      tiZANidine (ZANAFLEX) 4 MG tablet Take 4 mg by mouth at bedtime as needed for muscle spasms.     Vitamin D, Cholecalciferol, 1000 UNITS CAPS Take 2,000 mg by mouth daily.     No facility-administered medications prior to visit.     Allergies:   Codeine and Gabapentin   Past Medical History:  Diagnosis Date   Arthritis    "my back's eat up w/arthritis" (12/26/2012()   Bulging lumbar disc    "I've got 5; they've done 3 injections then cut nerves last time" (12/26/2008)   CAD (coronary artery disease)    a. 12/2012 Cath/PCI: LM nl, LAD nl, LCX dom, nl, OM1 99 (2.5x20 Promus DES), LPDA 20-30, RCA sm, nl, EF 55-65%.   Chronic radicular pain of lower back    "both legs" (12/26/2012)   CKD (chronic kidney disease), stage III (HCC)    a. s/p renal transplant 2012.   Exertional shortness of breath    "just for the last 3  wks" (12/26/2012)   GERD (gastroesophageal reflux disease)    Headache(784.0)    "probably monthly" (12/26/2012)   Hyperlipidemia    Hypertension    Hypothyroidism     Past Surgical History:  Procedure Laterality Date   CORONARY ANGIOPLASTY WITH STENT PLACEMENT  12/26/2012   "1" (12/26/2012)   KIDNEY TRANSPLANT  2012   LEFT HEART CATHETERIZATION WITH CORONARY ANGIOGRAM N/A 12/26/2012   Procedure: LEFT HEART CATHETERIZATION WITH CORONARY ANGIOGRAM;  Surgeon: Peter M Swaziland, MD;  Location: [facility] CATH LAB;  Service: Cardiovascular;  Laterality: N/A;   ORIF TIBIA & FIBULA FRACTURES Right 1990's   "titanium rod and 4 screws"   REATTACHMENT HAND Right 1979; 1986   "middle finger; thumb"   RENAL BIOPSY, PERCUTANEOUS Right ~ 2006     Social History:  The patient  reports that he quit smoking about 44 years ago. His smoking use included cigarettes. He has a 3.00 pack-year smoking history. He has never used smokeless tobacco. He reports that he does not drink alcohol and does not  use drugs.   Family History:  The patient's family history includes Diabetes in his father; Heart disease in his father; Hyperlipidemia in his father.    ROS:  Please see the history of present illness. All other systems are reviewed and  Negative to the above problem except as noted.    PHYSICAL EXAM: VS:  BP 110/70 (BP Location: Left Arm, Patient Position: Sitting, Cuff Size: Normal)   Pulse 81   Ht 5\' 10"  (1.778 m)   Wt 227 lb (103 kg)   BMI 32.57 kg/m   GEN: Obese 61 yo, in no acute distress HEENT: normal Neck: JVP normal  No, carotid bruit Cardiac: RRR; no murmurs  No LE edema  Respiratory:  clear to auscultation bilaterally GI: soft, nontender, nondistended, + BS  No hepatomegaly  MS: no deformity Moving all extremities   Skin: warm and dry, no rash Neuro:  Strength and sensation are intact Psych: euthymic mood, full affect   EKG:  EKG is  ordered today.  SR 81 bpm     2015CHO Study Conclusions   - Left ventricle: The cavity size was mildly dilated. Wall    thickness was normal. Systolic function was normal. The estimated    ejection fraction was in the range of 55% to 60%.  - Aortic valve: There was mild regurgitation.  - Mitral valve: There was mild regurgitation.     Cath 2014 PCI Data: Vessel - OM1/Segment - proximal Percent Stenosis (pre)  99% TIMI-flow 1 Stent 2.5 x 20 mm Promus Percent Stenosis (post) 0% TIMI-flow (post) 3   Final Conclusions:   1. Single vessel obstructive CAD  2. Normal LV function.    Lipid Panel    Component Value Date/Time   CHOL 135 07/03/2019 1028   TRIG 148 07/03/2019 1028   HDL 48 07/03/2019 1028   CHOLHDL 2.8 07/03/2019 1028   CHOLHDL 5 01/07/2013 1121   VLDL 34.4 01/07/2013 1121   LDLCALC 62 07/03/2019 1028      Wt Readings from Last 3 Encounters:  01/04/22 227 lb (103 kg)  09/03/20 203 lb 6.4 oz (92.3 kg)  07/03/19 217 lb 9.6 oz (98.7 kg)      ASSESSMENT AND PLAN:  1  CAD   Remote intervention.  Pt  without symptoms of angina   FOllow   2  HTN  BP is well controlled     3   HL  Will get lipids  today  4  Metabolics  Will check A1C    Tentative f/u 1 year      Signed, Dietrich Pates, MD  01/04/2022 8:20 PM    [facility] [address] [place], [place], [place]  [address] Phone: [phone]; Fax: [phone]

## 2022-01-04 ENCOUNTER — Ambulatory Visit: Payer: Medicare HMO | Attending: Internal Medicine | Admitting: Internal Medicine

## 2022-01-04 ENCOUNTER — Ambulatory Visit: Payer: Medicare HMO | Admitting: Internal Medicine

## 2022-01-04 ENCOUNTER — Encounter: Payer: Self-pay | Admitting: Internal Medicine

## 2022-01-04 VITALS — BP 110/70 | HR 81 | Ht 70.0 in | Wt 227.0 lb

## 2022-01-04 DIAGNOSIS — I10 Essential (primary) hypertension: Secondary | ICD-10-CM

## 2022-01-04 DIAGNOSIS — I2510 Atherosclerotic heart disease of native coronary artery without angina pectoris: Secondary | ICD-10-CM

## 2022-01-04 DIAGNOSIS — E782 Mixed hyperlipidemia: Secondary | ICD-10-CM

## 2022-01-04 NOTE — Patient Instructions (Signed)
Medication Instructions:   *If you need a refill on your cardiac medications before your next appointment, please call your pharmacy*   Lab Work: NMR, HGBA1C TODAY  If you have labs (blood work) drawn today and your tests are completely normal, you will receive your results only by: MyChart Message (if you have MyChart) OR A paper copy in the mail If you have any lab test that is abnormal or we need to change your treatment, we will call you to review the results.   Testing/Procedures:    Follow-Up: At [facility], you and your health needs are our priority.  As part of our continuing mission to provide you with exceptional heart care, we have created designated Provider Care Teams.  These Care Teams include your primary Cardiologist (physician) and Advanced Practice Providers (APPs -  Physician Assistants and Nurse Practitioners) who all work together to provide you with the care you need, when you need it.  We recommend signing up for the patient portal called "MyChart".  Sign up information is provided on this After Visit Summary.  MyChart is used to connect with patients for Virtual Visits (Telemedicine).  Patients are able to view lab/test results, encounter notes, upcoming appointments, etc.  Non-urgent messages can be sent to your provider as well.   To learn more about what you can do with MyChart, go to [email or web address].    Your next appointment:   1 year(s)  The format for your next appointment:  In Person  DR PAULA ROSS   Other Instructions   Important Information About Sugar

## 2022-01-05 LAB — NMR, LIPOPROFILE
Cholesterol, Total: 129 mg/dL (ref 100–199)
HDL Particle Number: 31 umol/L (ref >=30.5)
HDL-C: 34 mg/dL — ABNORMAL LOW (ref >39)
LDL Particle Number: 751 nmol/L (ref <1000)
LDL Size: 20.1 nm — ABNORMAL LOW (ref >20.5)
LDL-C (NIH Calc): 54 mg/dL (ref 0–99)
LP-IR Score: 76 — ABNORMAL HIGH (ref <=45)
Small LDL Particle Number: 473 nmol/L (ref <=527)
Triglycerides: 257 mg/dL — ABNORMAL HIGH (ref 0–149)

## 2022-01-05 LAB — HEMOGLOBIN A1C
Est. average glucose Bld gHb Est-mCnc: 120 mg/dL
Hgb A1c MFr Bld: 5.8 % — ABNORMAL HIGH (ref 4.8–5.6)

## 2022-01-19 ENCOUNTER — Other Ambulatory Visit: Payer: Self-pay | Admitting: Internal Medicine

## 2022-02-19 ENCOUNTER — Other Ambulatory Visit: Payer: Self-pay | Admitting: Internal Medicine

## 2022-08-16 ENCOUNTER — Other Ambulatory Visit: Payer: Self-pay | Admitting: Internal Medicine

## 2022-12-13 ENCOUNTER — Telehealth: Payer: Self-pay | Admitting: Internal Medicine

## 2022-12-13 NOTE — Telephone Encounter (Signed)
New Message:     Patient said he was told a few years ago, that is he ever had Covid to go to the hospital and get some type of shot. He says his wife test positive for Covid today. He wants to know what does he need to do?

## 2022-12-13 NOTE — Telephone Encounter (Signed)
Pt says he feels fine no symptoms but his wife has COVID so he is at the Urgent Care getting tested... if positive... they both will set up and virtual My  Chart [place] visit and plan for possible Paxlovid.

## 2022-12-16 ENCOUNTER — Telehealth: Payer: Self-pay | Admitting: Cardiology

## 2022-12-16 NOTE — Telephone Encounter (Signed)
Reviewed REcomm:   Keep pulse ox to follow O2 sats Stay hydrated     Re Paxlovid.   No contraindication to taking     Mixed results of effectiveness from what I have heard

## 2022-12-16 NOTE — Telephone Encounter (Signed)
   Patient's spouse and patient called answering service today to inform our office that patient tested positive for COVID. Per patient, his PCP has already "sent in prescriptions" but he was told to call his cardiologist and nephrologist as well. I am unable to see notes from PCP but suspect he was prescribed anti-viral regimen. Patient reports headache and mild dyspnea. Confirmed with patient that he should continue with COVID treatment plan as outlined by PCP and seek re-evaluation if symptoms progress.  Perlie Gold, PA-C

## 2022-12-19 NOTE — Telephone Encounter (Signed)
Tried calling the pt back to endorse recommendations per Dr. Tenny Craw, and he did not answer and voicemail is not set-up at this time.

## 2022-12-30 LAB — LAB REPORT - SCANNED: EGFR: 45

## 2023-03-16 ENCOUNTER — Other Ambulatory Visit: Payer: Self-pay | Admitting: Internal Medicine

## 2023-03-16 NOTE — Telephone Encounter (Signed)
*  STAT* If patient is at the pharmacy, call can be transferred to refill team.   1. Which medications need to be refilled? (please list name of each medication and dose if known) Rosuvastatin   2. Would you like to learn more about the convenience, safety, & potential cost savings by using the [facility]?     3. Are you open to using the [facility] (Type [facility].    4. Which pharmacy/location (including street and city if local pharmacy) is medication to be sent to? [facility]   5. Do they need a 30 day or 90 day supply? 30 days and refills

## 2023-05-03 ENCOUNTER — Telehealth: Payer: Self-pay | Admitting: Internal Medicine

## 2023-05-03 ENCOUNTER — Other Ambulatory Visit: Payer: Self-pay | Admitting: Internal Medicine

## 2023-05-03 MED ORDER — ROSUVASTATIN CALCIUM 40 MG PO TABS
40.0000 mg | ORAL_TABLET | Freq: Every day | ORAL | 0 refills | Status: DC
Start: 2023-05-03 — End: 2023-05-09

## 2023-05-03 NOTE — Telephone Encounter (Signed)
 Pt's medication was sent to pt's pharmacy as requested. Confirmation received.

## 2023-05-03 NOTE — Telephone Encounter (Signed)
*  STAT* If patient is at the pharmacy, call can be transferred to refill team.   1. Which medications need to be refilled? (please list name of each medication and dose if known)   rosuvastatin (CRESTOR) 40 MG tablet   2. Would you like to learn more about the convenience, safety, & potential cost savings by using the [facility]?   3. Are you open to using the [facility] ([facility]. ).  4. Which pharmacy/location (including street and city if local pharmacy) is medication to be sent to?  [facility] - [place], [place] - [address]   5. Do they need a 30 day or 90 day supply?   30 day  Patient stated he is completely out of this medication.

## 2023-05-08 NOTE — Progress Notes (Unsigned)
 Cardiology Office Note    Patient Name: Robert Walton Date of Encounter: 05/09/2023  Primary Care Provider:  Raelene Bullocks, MD Primary Cardiologist:  None Primary Electrophysiologist: None   Past Medical History    Past Medical History:  Diagnosis Date   Arthritis    "my back's eat up w/arthritis" (12/26/2012()   Bulging lumbar disc    "I've got 5; they've done 3 injections then cut nerves last time" (12/26/2008)   CAD (coronary artery disease)    a. 12/2012 Cath/PCI: LM nl, LAD nl, LCX dom, nl, OM1 99 (2.5x20 Promus DES), LPDA 20-30, RCA sm, nl, EF 55-65%.   Chronic radicular pain of lower back    "both legs" (12/26/2012)   CKD (chronic kidney disease), stage III (HCC)    a. s/p renal transplant 2012.   Exertional shortness of breath    "just for the last 3 wks" (12/26/2012)   GERD (gastroesophageal reflux disease)    Headache(784.0)    "probably monthly" (12/26/2012)   Hyperlipidemia    Hypertension    Hypothyroidism     History of Present Illness  Robert Walton is a 63 y.o. male with a PMH of CAD s/p PCI/DES to OM1, HTN, s/p kidney transplant, HLD HTN, hypothyroidism who presents today for follow-up.  Mr. Robert Walton has been followed by Dr. Avanell Walton since 2014 for complaint of angina.  He underwent LHC and was found to have severe stenosis of OM1 treated with DES.  He was started on medical therapy with statin and ASA 81 mg.  He has continued to do well and was last seen by Dr. Avanell Walton on 01/04/2022 no chest pain and was doing some walking for exercise.  Blood pressure was under good control and patient was compliant with current medications.  Mr. Robert Walton presents today for follow up with his wife and reports that he has been experiencing severe chest pain for the past couple of weeks. The pain is constant, occurring both at rest and during activity, and is exacerbated by physical exertion such as walking fast or climbing stairs. He describes the pain as a tightness and heaviness  in the chest, sometimes feeling 'real sore' and 'like a bruise'. The pain does not radiate to the arm or neck and is not associated with eating. The pain is persistent and does not ease off completely. He has a history of coronary artery disease, having undergone a heart catheterization in 2014 where a stent was placed due to a 99% blockage in one artery. He has been on medical therapy for other arteries that did not require stenting at that time. He is currently taking baby aspirin  daily and has nitroglycerin  prescribed, although he has not used it recently. He also has a history of a kidney transplant and is under the care of a nephrologist. He is being treated for high cholesterol and high blood pressure. He takes Crestor  40 mg for cholesterol management and olmesartan  for blood pressure control. He also takes Lasix , which was recently reduced to once a day due to excessive urination. In terms of lifestyle, he does not engage in regular exercise but stays active by working in the yard. He experiences swelling in his ankles, particularly after walking or traveling, and monitors his salt intake carefully. He does not add salt to his food and is mindful of consuming foods high in sodium. Patient denies palpitations, dyspnea, PND, orthopnea, nausea, vomiting, dizziness, syncope, edema, weight gain, or early satiety.  Discussed the use of AI scribe  software for clinical note transcription with the patient, who gave verbal consent to proceed.  History of Present Illness   Review of Systems  Please see the history of present illness.    All other systems reviewed and are otherwise negative except as noted above.  Physical Exam    Wt Readings from Last 3 Encounters:  05/09/23 229 lb 9.6 oz (104.1 kg)  01/04/22 227 lb (103 kg)  09/03/20 203 lb 6.4 oz (92.3 kg)   VS: Vitals:   05/09/23 0932  BP: 126/70  Pulse: 88  SpO2: 94%  ,Body mass index is 32.94 kg/m. GEN: Well nourished, well developed in  no acute distress Neck: No JVD; No carotid bruits Pulmonary: Clear to auscultation without rales, wheezing or rhonchi  Cardiovascular: Normal rate. Regular rhythm. Normal S1. Normal S2.  Waxing waning chest discomfort Murmurs: There is no murmur.  ABDOMEN: Soft, non-tender, non-distended EXTREMITIES:  No edema; No deformity   EKG/LABS/ Recent Cardiac Studies   ECG personally reviewed by me today -sinus rhythm with rate of 88 bpm and no acute changes consistent with previous EKG.  Risk Assessment/Calculations:          Lab Results  Component Value Date   WBC 6.0 07/03/2019   HGB 15.3 07/03/2019   HCT 47.5 07/03/2019   MCV 84 07/03/2019   PLT 236 07/03/2019   Lab Results  Component Value Date   CREATININE 1.48 (H) 07/03/2019   BUN 14 07/03/2019   NA 139 07/03/2019   K 3.9 07/03/2019   CL 99 07/03/2019   CO2 25 07/03/2019   Lab Results  Component Value Date   CHOL 135 07/03/2019   HDL 48 07/03/2019   LDLCALC 62 07/03/2019   TRIG 148 07/03/2019   CHOLHDL 2.8 07/03/2019    Lab Results  Component Value Date   HGBA1C 5.8 (H) 01/04/2022   Assessment & Plan    1.  Coronary artery disease: -s/p LHC  with PCI/DES to OM1 in 2014 with no recent ischemic evaluation completed. -Chest pain constant, worsens with activity, relieved by nitroglycerin  administered in office today with resolution of symptoms. - Patient was started on Imdur  30 mg daily - Educate on nitroglycerin  use: 1 every 5 minutes up to 3 doses, seek emergency care if no relief. - Order nuclear stress test. -Order updated echocardiogram - ED precautions discussed and patient advised to seek care if pain is not relieved with as needed nitroglycerin   2.  Essential hypertension: - Patient's blood pressure today was 126/70 - Continue Norvasc  10 mg, olmesartan  5 mg  3.  Hyperlipidemia: - Patient's last LDL cholesterol was stable at 54 - Continue Crestor  40 mg daily  4.  CKD stage III: -s/p renal transplant  2012 and annual follow-ups with [facility] - Continue current treatment plan per [facility]  5. Peripheral edema Intermittent edema with activity. On Lasix , dosage reduced due to excessive diuresis. Monitor weight and salt intake. - Increase Lasix  dosage based on weight. - Educate on daily weight monitoring and salt intake. - Provide PRN Lasix  for significant weight gain.  Disposition: Follow-up with None or APP in 2 months Informed Consent   Shared Decision Making/Informed Consent The risks [chest pain, shortness of breath, cardiac arrhythmias, dizziness, blood pressure fluctuations, myocardial infarction, stroke/transient ischemic attack, nausea, vomiting, allergic reaction, radiation exposure, metallic taste sensation and life-threatening complications (estimated to be 1 in 10,000)], benefits (risk stratification, diagnosing coronary artery disease, treatment guidance) and alternatives of a nuclear stress test  were discussed in detail with Mr. Dorrance and he agrees to proceed.      Signed, Francene Ing, Retha Cast, NP 05/09/2023, 10:02 AM [facility]

## 2023-05-09 ENCOUNTER — Encounter: Payer: Self-pay | Admitting: Nurse Practitioner

## 2023-05-09 ENCOUNTER — Ambulatory Visit: Attending: Nurse Practitioner | Admitting: Nurse Practitioner

## 2023-05-09 VITALS — BP 126/70 | HR 88 | Ht 70.0 in | Wt 229.6 lb

## 2023-05-09 DIAGNOSIS — I2510 Atherosclerotic heart disease of native coronary artery without angina pectoris: Secondary | ICD-10-CM | POA: Diagnosis not present

## 2023-05-09 DIAGNOSIS — R600 Localized edema: Secondary | ICD-10-CM

## 2023-05-09 DIAGNOSIS — I10 Essential (primary) hypertension: Secondary | ICD-10-CM | POA: Diagnosis not present

## 2023-05-09 DIAGNOSIS — E782 Mixed hyperlipidemia: Secondary | ICD-10-CM | POA: Diagnosis not present

## 2023-05-09 DIAGNOSIS — N183 Chronic kidney disease, stage 3 unspecified: Secondary | ICD-10-CM | POA: Diagnosis not present

## 2023-05-09 DIAGNOSIS — R079 Chest pain, unspecified: Secondary | ICD-10-CM | POA: Diagnosis not present

## 2023-05-09 DIAGNOSIS — Z940 Kidney transplant status: Secondary | ICD-10-CM

## 2023-05-09 MED ORDER — OLMESARTAN MEDOXOMIL 5 MG PO TABS: 5.0000 mg | ORAL_TABLET | Freq: Every day | ORAL | 3 refills | Status: AC

## 2023-05-09 MED ORDER — FUROSEMIDE 20 MG PO TABS: 20.0000 mg | ORAL_TABLET | Freq: Every day | ORAL | 1 refills | Status: AC

## 2023-05-09 MED ORDER — ROSUVASTATIN CALCIUM 40 MG PO TABS: 40.0000 mg | ORAL_TABLET | Freq: Every day | ORAL | 3 refills | Status: AC

## 2023-05-09 MED ORDER — NITROGLYCERIN 0.4 MG SL SUBL
0.4000 mg | SUBLINGUAL_TABLET | SUBLINGUAL | Status: AC | PRN
Start: 2023-05-09 — End: ?
  Administered 2023-05-09: 0.4 mg via SUBLINGUAL

## 2023-05-09 MED ORDER — FUROSEMIDE 20 MG PO TABS
20.0000 mg | ORAL_TABLET | Freq: Every day | ORAL | 3 refills | Status: DC
Start: 2023-05-09 — End: 2023-05-09

## 2023-05-09 MED ORDER — AMLODIPINE BESYLATE 10 MG PO TABS: 10.0000 mg | ORAL_TABLET | Freq: Every day | ORAL | 3 refills | Status: DC

## 2023-05-09 MED ORDER — ISOSORBIDE MONONITRATE ER 30 MG PO TB24
30.0000 mg | ORAL_TABLET | Freq: Every day | ORAL | 3 refills | Status: DC
Start: 2023-05-09 — End: 2023-09-20

## 2023-05-09 NOTE — Patient Instructions (Signed)
 Medication Instructions:  START Imdur  30mg  Take 1 tablet once a day  CAN take an additional lasix  as needed *If you need a refill on your cardiac medications before your next appointment, please call your pharmacy*  Lab Work: None ordered If you have labs (blood work) drawn today and your tests are completely normal, you will receive your results only by: MyChart Message (if you have MyChart) OR A paper copy in the mail If you have any lab test that is abnormal or we need to change your treatment, we will call you to review the results.  Testing/Procedures: Your physician has requested that you have an echocardiogram. Echocardiography is a painless test that uses sound waves to create images of your heart. It provides your doctor with information about the size and shape of your heart and how well your heart's chambers and valves are working. This procedure takes approximately one hour. There are no restrictions for this procedure. Please do NOT wear cologne, perfume, aftershave, or lotions (deodorant is allowed). Please arrive 15 minutes prior to your appointment time.  Please note: We ask at that you not bring children with you during ultrasound (echo/ vascular) testing. Due to room size and safety concerns, children are not allowed in the ultrasound rooms during exams. Our front office staff cannot provide observation of children in our lobby area while testing is being conducted. An adult accompanying a patient to their appointment will only be allowed in the ultrasound room at the discretion of the ultrasound technician under special circumstances. We apologize for any inconvenience.  Your physician has requested that you have a lexiscan myoview. For further information please visit [email or web address] Please follow instruction sheet, as given.   Follow-Up: At [facility], you and your health needs are our priority.  As part of our continuing mission to provide you with  exceptional heart care, our providers are all part of one team.  This team includes your primary Cardiologist (physician) and Advanced Practice Providers or APPs (Physician Assistants and Nurse Practitioners) who all work together to provide you with the care you need, when you need it.  Your next appointment:   2 month(s)  Provider:   Charles Connor, NP  We recommend signing up for the patient portal called "MyChart".  Sign up information is provided on this After Visit Summary.  MyChart is used to connect with patients for Virtual Visits (Telemedicine).  Patients are able to view lab/test results, encounter notes, upcoming appointments, etc.  Non-urgent messages can be sent to your provider as well.   To learn more about what you can do with MyChart, go to [email or web address].   Other Instructions Please check your weight daily. Please contact the office if you gain more than 2lbs in a day or 5lbs in a week.  Limit your salt intake to 1500-2000mg  per day or 500mg  of Sodium per meal.       1st Floor: - Lobby - Registration  - Pharmacy  - Lab - Cafe  2nd Floor: - PV Lab - Diagnostic Testing (echo, CT, nuclear med)  3rd Floor: - Vacant  4th Floor: - TCTS (cardiothoracic surgery) - AFib Clinic - Structural Heart Clinic - Vascular Surgery  - Vascular Ultrasound  5th Floor: - HeartCare Cardiology (general and EP) - Clinical Pharmacy for coumadin, hypertension, lipid, weight-loss medications, and med management appointments    Valet parking services will be available as well.

## 2023-05-10 ENCOUNTER — Encounter: Payer: Self-pay | Admitting: Internal Medicine

## 2023-05-11 ENCOUNTER — Other Ambulatory Visit: Payer: Self-pay

## 2023-05-11 ENCOUNTER — Telehealth: Payer: Self-pay | Admitting: Nurse Practitioner

## 2023-05-11 MED ORDER — SM VITAMIN D3 100 MCG (4000 UT) PO CAPS: 4000.0000 [IU] | ORAL_CAPSULE | Freq: Every day | ORAL | Status: AC

## 2023-05-11 MED ORDER — NITROGLYCERIN 0.4 MG SL SUBL: 0.4000 mg | SUBLINGUAL_TABLET | SUBLINGUAL | 3 refills | Status: AC | PRN

## 2023-05-11 NOTE — Telephone Encounter (Signed)
 Pt's medication was sent to pt's pharmacy as requested. Confirmation received.

## 2023-05-11 NOTE — Telephone Encounter (Signed)
*  STAT* If patient is at the pharmacy, call can be transferred to refill team.   1. Which medications need to be refilled? (please list name of each medication and dose if known)   nitroGLYCERIN  (NITROSTAT ) 0.4 MG SL tablet     2. Would you like to learn more about the convenience, safety, & potential cost savings by using the [facility]?  NO   3. Are you open to using the [facility] NO    4. Which pharmacy/location (including street and city if local pharmacy) is medication to be sent to? [facility] - [place], [place] - [address]    5. Do they need a 30 day or 90 day supply? 90

## 2023-06-05 ENCOUNTER — Other Ambulatory Visit: Payer: Self-pay | Admitting: Nurse Practitioner

## 2023-06-05 DIAGNOSIS — N183 Chronic kidney disease, stage 3 unspecified: Secondary | ICD-10-CM

## 2023-06-05 DIAGNOSIS — I2510 Atherosclerotic heart disease of native coronary artery without angina pectoris: Secondary | ICD-10-CM

## 2023-06-05 DIAGNOSIS — Z940 Kidney transplant status: Secondary | ICD-10-CM

## 2023-06-05 DIAGNOSIS — I10 Essential (primary) hypertension: Secondary | ICD-10-CM

## 2023-06-05 DIAGNOSIS — R079 Chest pain, unspecified: Secondary | ICD-10-CM

## 2023-06-05 DIAGNOSIS — E782 Mixed hyperlipidemia: Secondary | ICD-10-CM

## 2023-06-08 ENCOUNTER — Telehealth (HOSPITAL_COMMUNITY): Payer: Self-pay | Admitting: *Deleted

## 2023-06-08 NOTE — Telephone Encounter (Signed)
 Reminder letter with instructions sent to patient via [employer]PS for upcoming Myoview on 06/14/23 at 8:00

## 2023-06-14 ENCOUNTER — Encounter (HOSPITAL_COMMUNITY): Payer: Self-pay | Admitting: Radiology

## 2023-06-14 ENCOUNTER — Ambulatory Visit (HOSPITAL_COMMUNITY)
Admission: RE | Admit: 2023-06-14 | Discharge: 2023-06-14 | Disposition: A | Discharge status: Home / Self Care | Source: Ambulatory Visit | Attending: Cardiovascular Disease | Admitting: Cardiovascular Disease

## 2023-06-14 ENCOUNTER — Ambulatory Visit (HOSPITAL_COMMUNITY)
Admission: RE | Admit: 2023-06-14 | Discharge: 2023-06-14 | Disposition: A | Discharge status: Home / Self Care | Source: Ambulatory Visit | Attending: Nurse Practitioner | Admitting: Nurse Practitioner

## 2023-06-14 ENCOUNTER — Ambulatory Visit: Payer: Self-pay | Admitting: Nurse Practitioner

## 2023-06-14 DIAGNOSIS — E782 Mixed hyperlipidemia: Secondary | ICD-10-CM

## 2023-06-14 DIAGNOSIS — R079 Chest pain, unspecified: Secondary | ICD-10-CM | POA: Insufficient documentation

## 2023-06-14 DIAGNOSIS — I10 Essential (primary) hypertension: Secondary | ICD-10-CM

## 2023-06-14 DIAGNOSIS — N183 Chronic kidney disease, stage 3 unspecified: Secondary | ICD-10-CM

## 2023-06-14 DIAGNOSIS — I2510 Atherosclerotic heart disease of native coronary artery without angina pectoris: Secondary | ICD-10-CM | POA: Diagnosis present

## 2023-06-14 DIAGNOSIS — Z940 Kidney transplant status: Secondary | ICD-10-CM | POA: Diagnosis present

## 2023-06-14 LAB — ECHOCARDIOGRAM COMPLETE
Area-P 1/2: 4.06 cm2
Est EF: 55
S' Lateral: 3.2 cm

## 2023-06-15 NOTE — Telephone Encounter (Signed)
 Pt called in stating he reviewed results via mychart and asked if he still needs to have stress test. Please advise.

## 2023-06-20 ENCOUNTER — Ambulatory Visit (HOSPITAL_COMMUNITY)
Admission: RE | Admit: 2023-06-20 | Discharge: 2023-06-20 | Disposition: A | Discharge status: Home / Self Care | Source: Ambulatory Visit | Attending: Cardiovascular Disease | Admitting: Cardiovascular Disease

## 2023-06-20 DIAGNOSIS — R079 Chest pain, unspecified: Secondary | ICD-10-CM

## 2023-06-20 DIAGNOSIS — E782 Mixed hyperlipidemia: Secondary | ICD-10-CM

## 2023-06-20 DIAGNOSIS — I2510 Atherosclerotic heart disease of native coronary artery without angina pectoris: Secondary | ICD-10-CM | POA: Diagnosis present

## 2023-06-20 DIAGNOSIS — Z940 Kidney transplant status: Secondary | ICD-10-CM

## 2023-06-20 DIAGNOSIS — N183 Chronic kidney disease, stage 3 unspecified: Secondary | ICD-10-CM

## 2023-06-20 DIAGNOSIS — I10 Essential (primary) hypertension: Secondary | ICD-10-CM

## 2023-06-20 LAB — MYOCARDIAL PERFUSION IMAGING
Base ST Depression (mm): 0 mm
LV dias vol: 130 mL (ref 62–150)
LV sys vol: 54 mL
Nuc Stress EF: 58 %
Peak HR: 106 {beats}/min
Rest HR: 74 {beats}/min
Rest Nuclear Isotope Dose: 10.7 mCi
SDS: 0
SRS: 2
SSS: 1
ST Depression (mm): 0 mm
Stress Nuclear Isotope Dose: 31.1 mCi
TID: 1.21

## 2023-06-20 MED ORDER — TECHNETIUM TC 99M TETROFOSMIN IV KIT
31.1000 | PACK | Freq: Once | INTRAVENOUS | Status: AC | PRN
  Administered 2023-06-20: 31.1 via INTRAVENOUS

## 2023-06-20 MED ORDER — REGADENOSON 0.4 MG/5ML IV SOLN
0.4000 mg | Freq: Once | INTRAVENOUS | Status: AC
  Administered 2023-06-20: 0.4 mg via INTRAVENOUS

## 2023-06-20 MED ORDER — REGADENOSON 0.4 MG/5ML IV SOLN
INTRAVENOUS | Status: AC
  Filled 2023-06-20: qty 5

## 2023-06-20 MED ORDER — TECHNETIUM TC 99M TETROFOSMIN IV KIT
10.7000 | PACK | Freq: Once | INTRAVENOUS | Status: AC | PRN
Start: 2023-06-20 — End: 2023-06-20
  Administered 2023-06-20: 10.7 via INTRAVENOUS

## 2023-06-21 ENCOUNTER — Ambulatory Visit: Payer: Self-pay | Admitting: Nurse Practitioner

## 2023-07-13 ENCOUNTER — Ambulatory Visit: Admitting: Nurse Practitioner

## 2023-09-19 ENCOUNTER — Encounter (HOSPITAL_BASED_OUTPATIENT_CLINIC_OR_DEPARTMENT_OTHER): Payer: Self-pay

## 2023-09-19 NOTE — Progress Notes (Signed)
 " Cardiology Office Note    Patient Name: Robert Walton Date of Encounter: 09/19/2023  Primary Care Provider:  Addie Morton Redbird, MD Primary Cardiologist:  None Primary Electrophysiologist: None   Past Medical History    Past Medical History:  Diagnosis Date   Arthritis    my back's eat up w/arthritis (12/26/2012()   Bulging lumbar disc    I've got 5; they've done 3 injections then cut nerves last time (12/26/2008)   CAD (coronary artery disease)    a. 12/2012 Cath/PCI: LM nl, LAD nl, LCX dom, nl, OM1 99 (2.5x20 Promus DES), LPDA 20-30, RCA sm, nl, EF 55-65%.   Chronic radicular pain of lower back    both legs (12/26/2012)   CKD (chronic kidney disease), stage III (HCC)    a. s/p renal transplant 2012.   Exertional shortness of breath    just for the last 3 wks (12/26/2012)   GERD (gastroesophageal reflux disease)    Headache(784.0)    probably monthly (12/26/2012)   Hyperlipidemia    Hypertension    Hypothyroidism     History of Present Illness  Robert Walton is a 63 y.o. male with a PMH of CAD s/p PCI/DES to OM1, HTN, s/p kidney transplant, HLD HTN, hypothyroidism who presents today for 84-month follow-up.  Robert Walton was last seen on 05/09/2023 for follow-up and reported severe chest pain that occurred 2 weeks prior which was exacerbated by physical exertion such as climbing stairs.  He was started on Imdur  30 mg and advised to try as needed nitroglycerin  with updated TTE and nuclear stress test ordered for further evaluation.  Echo results showed normal EF with grade 1 DD and mildly dilated LA with mild aortic valve calcification and no stenosis.  The Lexiscan  Myoview  results showed no ischemia with coronary calcium  noted at moderate level and patient was advised to continue current statin therapy with no further evaluation at that time.  Robert Walton presents today for 72-month follow-up. In April, he experienced severe chest pain exacerbated by physical activity,  similar to previous episodes. He underwent a stress test and an echocardiogram following his chest pain episode in April. Since then, he has not had any recurrence of chest pain. He is currently on Crestor  for cholesterol management, and amlodipine  and olmesartan  for blood pressure control. His blood pressure has been noted to be low. He previously tried isosorbide  but discontinued it due to headaches. 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 He takes breaks as needed to avoid excessive sun exposure due to his medications. No dizziness, shortness of breath, or swelling in ankles. He feels well overall and has not experienced further symptoms since the initial episode. Patient denies chest pain, palpitations, dyspnea, PND, orthopnea, nausea, vomiting, dizziness, syncope, edema, weight gain, or early satiety.  Discussed the use of AI scribe software for clinical note transcription with the patient, who gave verbal consent to proceed.  History of Present Illness   Review of Systems  Please see the history of present illness.    All other systems reviewed and are otherwise negative except as noted above.  Physical Exam    Wt Readings from Last 3 Encounters:  06/20/23 229 lb (103.9 kg)  05/09/23 229 lb 9.6 oz (104.1 kg)  01/04/22 227 lb (103 kg)   CD:Uyzmz were no vitals filed for this visit.,There is no height or weight on file to calculate BMI. GEN: Well nourished, well developed  in no acute distress Neck: No JVD; No carotid bruits Pulmonary: Clear to auscultation without rales, wheezing or rhonchi  Cardiovascular: Normal rate. Regular rhythm. Normal S1. Normal S2.   Murmurs: There is no murmur.  ABDOMEN: Soft, non-tender, non-distended EXTREMITIES:  No edema; No deformity   EKG/LABS/ Recent Cardiac Studies   ECG personally reviewed by me today -none completed today  Risk Assessment/Calculations:          Lab Results   Component Value Date   WBC 6.0 07/03/2019   HGB 15.3 07/03/2019   HCT 47.5 07/03/2019   MCV 84 07/03/2019   PLT 236 07/03/2019   Lab Results  Component Value Date   CREATININE 1.48 (H) 07/03/2019   BUN 14 07/03/2019   NA 139 07/03/2019   K 3.9 07/03/2019   CL 99 07/03/2019   CO2 25 07/03/2019   Lab Results  Component Value Date   CHOL 135 07/03/2019   HDL 48 07/03/2019   LDLCALC 62 07/03/2019   TRIG 148 07/03/2019   CHOLHDL 2.8 07/03/2019    Lab Results  Component Value Date   HGBA1C 5.8 (H) 01/04/2022   Assessment & Plan    Assessment & Plan  1.Coronary artery disease: -s/p LHC  with PCI/DES to OM1 in 2014 with recent Lexiscan  Myoview  completed showing no evidence of ischemia - Today patient reports no recurrence of discomfort since previous follow-up or heavy exertion. - He has an intolerance to Imdur  with severe headache with medication list updated. - Continue current GDMT with ASA 81 mg, Nitrostat  0.4 mg as needed and Crestor  40 mg daily - He is aware of ED precautions if chest discomfort returns and is not relieved with as needed Nitrostat .  2.  Essential hypertension: - Blood pressure today is well-controlled at 108/62 - Patient took BP medications prior to today's visit he is asymptomatic - Continue Norvasc  10 mg daily, Benicar  5 mg daily  3.  Hyperlipidemia: - Patient's last LDL cholesterol was 54 on 0/7975 - Continue Crestor  40 mg daily  4.  CKD stage III/renal transplant recipient: -s/p renal transplant 2012 and annual follow-ups with [facility] - Continue current treatment plan per [facility]    Disposition: Follow-up with Dr. Okey or APP in 6 months   Signed, Wyn Raddle, Jackee Shove, NP 09/19/2023, 8:22 AM [facility] "

## 2023-09-20 ENCOUNTER — Encounter: Payer: Self-pay | Admitting: Nurse Practitioner

## 2023-09-20 ENCOUNTER — Ambulatory Visit: Attending: Cardiovascular Disease | Admitting: Nurse Practitioner

## 2023-09-20 VITALS — BP 108/62 | HR 88 | Ht 70.0 in | Wt 220.0 lb

## 2023-09-20 DIAGNOSIS — E782 Mixed hyperlipidemia: Secondary | ICD-10-CM | POA: Diagnosis not present

## 2023-09-20 DIAGNOSIS — Z940 Kidney transplant status: Secondary | ICD-10-CM

## 2023-09-20 DIAGNOSIS — I2510 Atherosclerotic heart disease of native coronary artery without angina pectoris: Secondary | ICD-10-CM

## 2023-09-20 DIAGNOSIS — R600 Localized edema: Secondary | ICD-10-CM

## 2023-09-20 DIAGNOSIS — I10 Essential (primary) hypertension: Secondary | ICD-10-CM

## 2023-09-20 NOTE — Patient Instructions (Signed)
 Medication Instructions:  Your physician recommends that you continue on your current medications as directed. Please refer to the Current Medication list given to you today. *If you need a refill on your cardiac medications before your next appointment, please call your pharmacy*  Lab Work: None ordered If you have labs (blood work) drawn today and your tests are completely normal, you will receive your results only by: MyChart Message (if you have MyChart) OR A paper copy in the mail If you have any lab test that is abnormal or we need to change your treatment, we will call you to review the results.  Testing/Procedures: None ordered  Follow-Up: At [facility], you and your health needs are our priority.  As part of our continuing mission to provide you with exceptional heart care, our providers are all part of one team.  This team includes your primary Cardiologist (physician) and Advanced Practice Providers or APPs (Physician Assistants and Nurse Practitioners) who all work together to provide you with the care you need, when you need it.  Your next appointment:   6 month(s)  Provider:   Ola Berger, MD    We recommend signing up for the patient portal called "MyChart".  Sign up information is provided on this After Visit Summary.  MyChart is used to connect with patients for Virtual Visits (Telemedicine).  Patients are able to view lab/test results, encounter notes, upcoming appointments, etc.  Non-urgent messages can be sent to your provider as well.   To learn more about what you can do with MyChart, go to [email or web address].   Other Instructions

## 2023-10-01 ENCOUNTER — Other Ambulatory Visit: Payer: Self-pay | Admitting: Nurse Practitioner
